# Patient Record
Sex: Male | Born: 1945 | Race: White | Hispanic: No | Marital: Married | Smoking: Never smoker
Health system: Southern US, Community
[De-identification: ages and names within clinical notes are randomized; demographics above are authoritative.]

## PROBLEM LIST (undated history)

## (undated) DIAGNOSIS — E119 Type 2 diabetes mellitus without complications: Secondary | ICD-10-CM

## (undated) DIAGNOSIS — I10 Essential (primary) hypertension: Secondary | ICD-10-CM

## (undated) DIAGNOSIS — H9190 Unspecified hearing loss, unspecified ear: Secondary | ICD-10-CM

## (undated) DIAGNOSIS — G629 Polyneuropathy, unspecified: Secondary | ICD-10-CM

## (undated) DIAGNOSIS — D649 Anemia, unspecified: Secondary | ICD-10-CM

## (undated) DIAGNOSIS — G8929 Other chronic pain: Secondary | ICD-10-CM

## (undated) DIAGNOSIS — M549 Dorsalgia, unspecified: Secondary | ICD-10-CM

---

## 2016-09-09 ENCOUNTER — Inpatient Hospital Stay (HOSPITAL_COMMUNITY)
Admission: EM | Admit: 2016-09-09 | Discharge: 2016-09-15 | DRG: 377 | Disposition: A | Discharge status: Home / Self Care | Payer: Medicare Other | Attending: Internal Medicine | Admitting: Internal Medicine

## 2016-09-09 ENCOUNTER — Emergency Department (HOSPITAL_COMMUNITY): Payer: Medicare Other

## 2016-09-09 ENCOUNTER — Encounter (HOSPITAL_COMMUNITY): Payer: Self-pay | Admitting: Emergency Medicine

## 2016-09-09 DIAGNOSIS — M545 Low back pain: Secondary | ICD-10-CM | POA: Diagnosis present

## 2016-09-09 DIAGNOSIS — D62 Acute posthemorrhagic anemia: Secondary | ICD-10-CM | POA: Diagnosis not present

## 2016-09-09 DIAGNOSIS — R531 Weakness: Secondary | ICD-10-CM | POA: Diagnosis not present

## 2016-09-09 DIAGNOSIS — I9589 Other hypotension: Secondary | ICD-10-CM

## 2016-09-09 DIAGNOSIS — H9190 Unspecified hearing loss, unspecified ear: Secondary | ICD-10-CM | POA: Diagnosis present

## 2016-09-09 DIAGNOSIS — E119 Type 2 diabetes mellitus without complications: Secondary | ICD-10-CM

## 2016-09-09 DIAGNOSIS — I951 Orthostatic hypotension: Secondary | ICD-10-CM | POA: Diagnosis present

## 2016-09-09 DIAGNOSIS — E872 Acidosis: Secondary | ICD-10-CM | POA: Diagnosis present

## 2016-09-09 DIAGNOSIS — K254 Chronic or unspecified gastric ulcer with hemorrhage: Principal | ICD-10-CM | POA: Diagnosis present

## 2016-09-09 DIAGNOSIS — R6510 Systemic inflammatory response syndrome (SIRS) of non-infectious origin without acute organ dysfunction: Secondary | ICD-10-CM | POA: Diagnosis not present

## 2016-09-09 DIAGNOSIS — R42 Dizziness and giddiness: Secondary | ICD-10-CM

## 2016-09-09 DIAGNOSIS — E1140 Type 2 diabetes mellitus with diabetic neuropathy, unspecified: Secondary | ICD-10-CM | POA: Diagnosis present

## 2016-09-09 DIAGNOSIS — Z79891 Long term (current) use of opiate analgesic: Secondary | ICD-10-CM

## 2016-09-09 DIAGNOSIS — M549 Dorsalgia, unspecified: Secondary | ICD-10-CM

## 2016-09-09 DIAGNOSIS — Z7984 Long term (current) use of oral hypoglycemic drugs: Secondary | ICD-10-CM

## 2016-09-09 DIAGNOSIS — A419 Sepsis, unspecified organism: Secondary | ICD-10-CM | POA: Diagnosis not present

## 2016-09-09 DIAGNOSIS — Z049 Encounter for examination and observation for unspecified reason: Secondary | ICD-10-CM | POA: Diagnosis not present

## 2016-09-09 DIAGNOSIS — Z79899 Other long term (current) drug therapy: Secondary | ICD-10-CM

## 2016-09-09 DIAGNOSIS — I959 Hypotension, unspecified: Secondary | ICD-10-CM

## 2016-09-09 DIAGNOSIS — K921 Melena: Secondary | ICD-10-CM

## 2016-09-09 DIAGNOSIS — K279 Peptic ulcer, site unspecified, unspecified as acute or chronic, without hemorrhage or perforation: Secondary | ICD-10-CM

## 2016-09-09 DIAGNOSIS — R571 Hypovolemic shock: Secondary | ICD-10-CM | POA: Diagnosis present

## 2016-09-09 DIAGNOSIS — D649 Anemia, unspecified: Secondary | ICD-10-CM

## 2016-09-09 DIAGNOSIS — G8929 Other chronic pain: Secondary | ICD-10-CM | POA: Diagnosis present

## 2016-09-09 DIAGNOSIS — K269 Duodenal ulcer, unspecified as acute or chronic, without hemorrhage or perforation: Secondary | ICD-10-CM | POA: Diagnosis present

## 2016-09-09 HISTORY — DX: Dorsalgia, unspecified: M54.9

## 2016-09-09 HISTORY — DX: Other chronic pain: G89.29

## 2016-09-09 HISTORY — DX: Type 2 diabetes mellitus without complications: E11.9

## 2016-09-09 HISTORY — DX: Polyneuropathy, unspecified: G62.9

## 2016-09-09 HISTORY — DX: Unspecified hearing loss, unspecified ear: H91.90

## 2016-09-09 LAB — CBC WITH DIFFERENTIAL/PLATELET
BASOS ABS: 0 10*3/uL (ref 0.0–0.1)
Basophils Relative: 0 %
Eosinophils Absolute: 0.2 10*3/uL (ref 0.0–0.7)
Eosinophils Relative: 1 %
HEMATOCRIT: 29 % — AB (ref 39.0–52.0)
HEMOGLOBIN: 9 g/dL — AB (ref 13.0–17.0)
LYMPHS ABS: 3.4 10*3/uL (ref 0.7–4.0)
Lymphocytes Relative: 15 %
MCH: 22.7 pg — AB (ref 26.0–34.0)
MCHC: 31 g/dL (ref 30.0–36.0)
MCV: 73 fL — AB (ref 78.0–100.0)
Monocytes Absolute: 1.5 10*3/uL — ABNORMAL HIGH (ref 0.1–1.0)
Monocytes Relative: 7 %
NEUTROS ABS: 16.8 10*3/uL — AB (ref 1.7–7.7)
Neutrophils Relative %: 77 %
Platelets: 283 10*3/uL (ref 150–400)
RBC: 3.97 MIL/uL — AB (ref 4.22–5.81)
RDW: 18.6 % — ABNORMAL HIGH (ref 11.5–15.5)
WBC: 21.9 10*3/uL — AB (ref 4.0–10.5)

## 2016-09-09 LAB — I-STAT CG4 LACTIC ACID, ED: Lactic Acid, Venous: 4.56 mmol/L (ref 0.5–1.9)

## 2016-09-09 LAB — POC OCCULT BLOOD, ED: Fecal Occult Bld: NEGATIVE

## 2016-09-09 IMAGING — CR DG CHEST 1V PORT
1 series · 1 of 1 positions shown · non-contrast
Comparison: None.

CLINICAL DATA: Weakness

EXAM:
PORTABLE CHEST 1 VIEW

[portable]
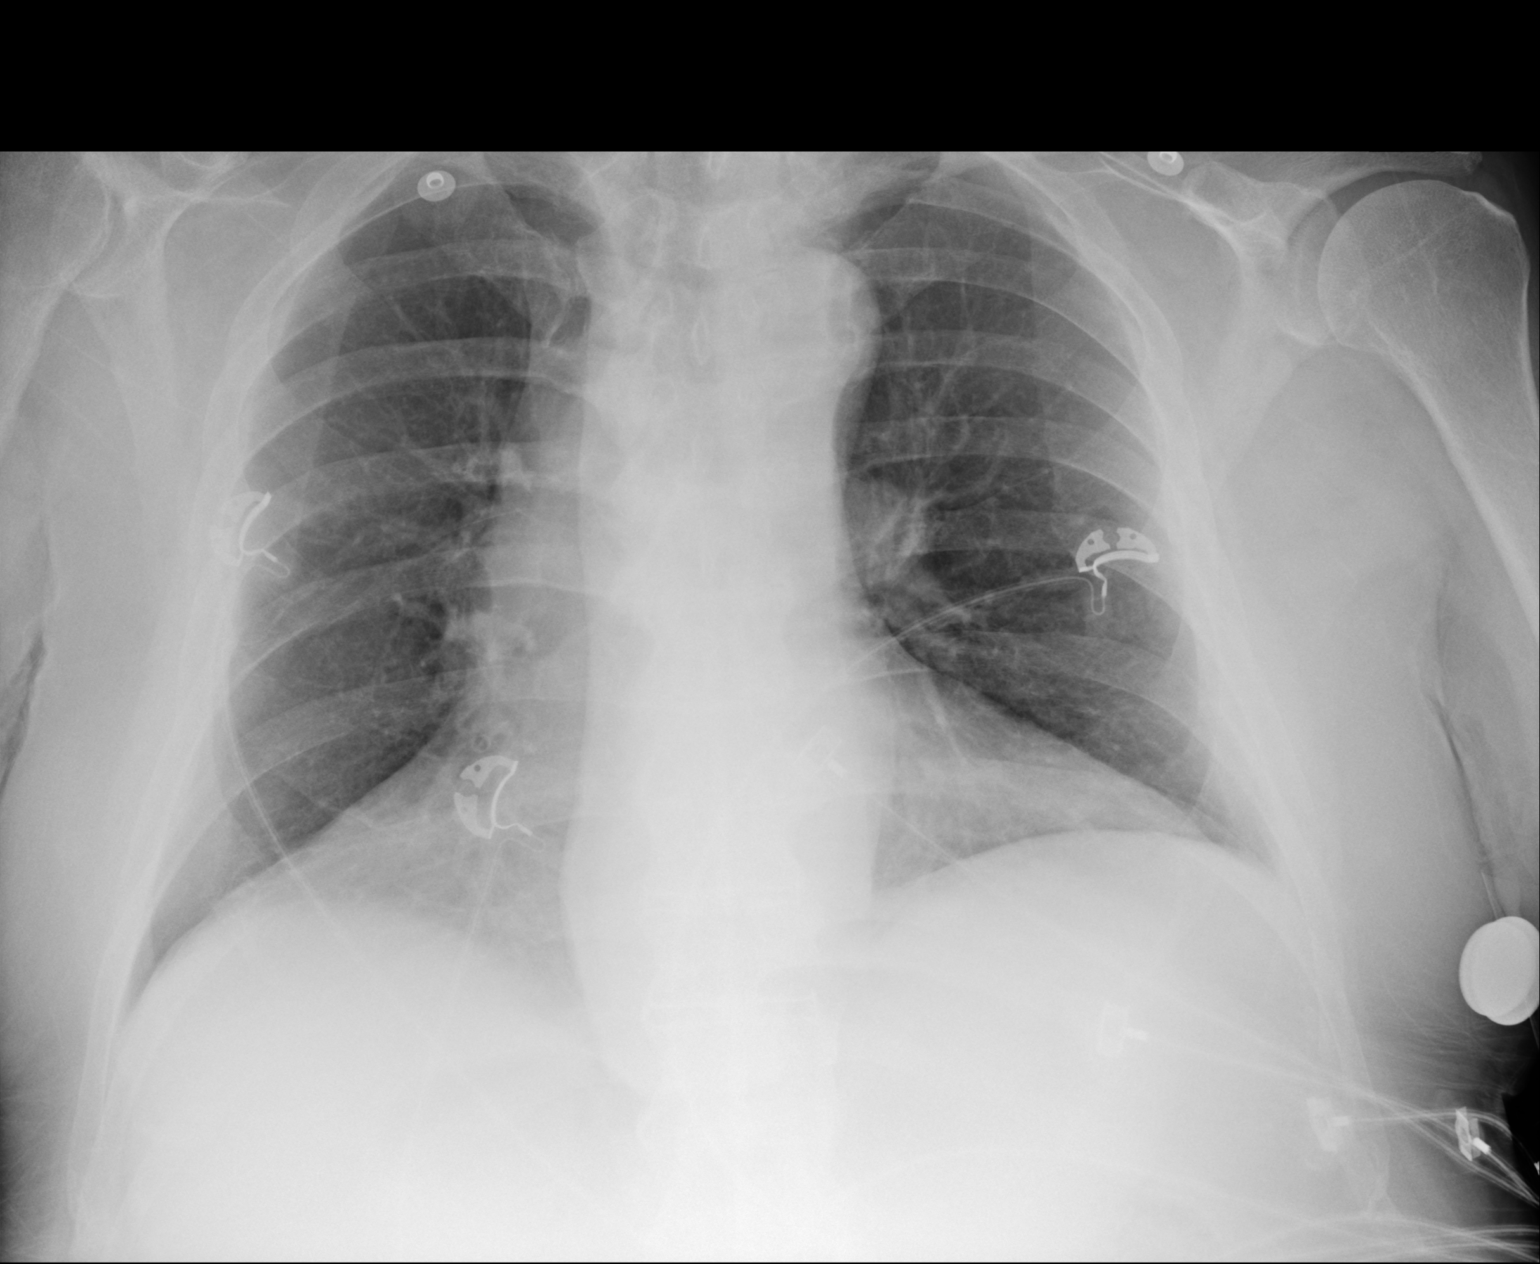

[1 of 1 positions shown; findings below may reference images not displayed]

FINDINGS: There is cardiomegaly without pulmonary edema or focal
consolidation. No pleural effusion or pneumothorax.
IMPRESSION: Cardiomegaly without active cardiopulmonary disease.

## 2016-09-09 MED ORDER — SODIUM CHLORIDE 0.9 % IV BOLUS (SEPSIS)
1000.0000 mL | Freq: Once | INTRAVENOUS | Status: AC
  Administered 2016-09-10: 1000 mL via INTRAVENOUS

## 2016-09-09 NOTE — ED Provider Notes (Signed)
[facility] DEPT Provider Note   CSN: [id number] Arrival date & time: 09/09/16  2302     History   Chief Complaint Chief Complaint  Patient presents with  . Weakness    HPI Richard Simmons is a 71 y.o. male.  The history is provided by the patient.  Weakness  This is a new problem. Episode onset: several hours ago. The problem has been gradually worsening. There was no focality noted. There has been no fever. Associated symptoms include shortness of breath and vomiting. Pertinent negatives include no chest pain and no headaches. Associated medical issues do not include trauma.  pt reports he was lying in the floor for his chronic back pain (he typically lays in floor for pain control) and when trying to get up he felt generalized weakness.  He also reports vomiting.   No HA No CP No ABD pain No new LE weakness No new meds No recent hospital stays [facility] It is reported he was vomiting coffee ground emesis   Past Medical History:  Diagnosis Date  . Chronic back pain   . Diabetes mellitus without complication ([place])   . Hearing loss   . Neuropathy     There are no active problems to display for this patient.   No past surgical history on file.     Home Medications    Prior to Admission medications   Not on File    Family History History reviewed. No pertinent family history.  Social History Social History  Substance Use Topics  . Smoking status: Never Smoker  . Smokeless tobacco: Current User  . Alcohol use No     Allergies   Patient has no known allergies.   Review of Systems Review of Systems  Constitutional: Positive for fatigue. Negative for fever.  Respiratory: Positive for shortness of breath.   Cardiovascular: Negative for chest pain.  Gastrointestinal: Positive for constipation and vomiting. Negative for abdominal pain and blood in stool.  Genitourinary: Negative for dysuria.  Neurological: Positive for weakness. Negative  for headaches.  All other systems reviewed and are negative.    Physical Exam Updated Vital Signs BP 105/66 (BP Location: Left Arm)   Pulse (!) 128   Temp 98.1 F (36.7 C) (Rectal)   Resp 15   Ht 1.778 m (5\' 10" )   Wt 98 kg (216 lb)   SpO2 94%   BMI 30.99 kg/m   Physical Exam CONSTITUTIONAL: Elderly, but no acute distress HEAD: Normocephalic/atraumatic EYES: EOMI/PERRL ENMT: Mucous membranes dry NECK: supple no meningeal signs SPINE/BACK:entire spine nontender CV: S1/S2 noted, no murmurs/rubs/gallops noted LUNGS: crackles bilaterally, no distress noted ABDOMEN: soft, nontender Rectal - stool color whitish, no blood/melena noted, male/nurse chaperone present GU:no cva tenderness NEURO: Pt is awake/alert/appropriate, moves all extremitiesx4.  No facial droop.  No arm/leg drift noted.   EXTREMITIES: pulses normal/equal, full ROM SKIN: warm, color normal PSYCH: no abnormalities of mood noted, alert and oriented to situation   ED Treatments / Results  Labs (all labs ordered are listed, but only abnormal results are displayed) Labs Reviewed  CBC WITH DIFFERENTIAL/PLATELET - Abnormal; Notable for the following:       Result Value   WBC 21.9 (*)    RBC 3.97 (*)    Hemoglobin 9.0 (*)    HCT 29.0 (*)    MCV 73.0 (*)    MCH 22.7 (*)    RDW 18.6 (*)    Neutro Abs 16.8 (*)    Monocytes  Absolute 1.5 (*)    All other components within normal limits  COMPREHENSIVE METABOLIC PANEL - Abnormal; Notable for the following:    Glucose, Bld 194 (*)    Calcium 8.1 (*)    Total Protein 5.4 (*)    Albumin 2.7 (*)    ALT 13 (*)    All other components within normal limits  TROPONIN I - Abnormal; Notable for the following:    Troponin I 0.04 (*)    All other components within normal limits  RAPID URINE DRUG SCREEN, HOSP PERFORMED - Abnormal; Notable for the following:    Opiates POSITIVE (*)    All other components within normal limits  I-STAT CG4 LACTIC ACID, ED - Abnormal;  Notable for the following:    Lactic Acid, Venous 4.56 (*)    All other components within normal limits  I-STAT CG4 LACTIC ACID, ED - Abnormal; Notable for the following:    Lactic Acid, Venous 4.76 (*)    All other components within normal limits  CULTURE, BLOOD (ROUTINE X 2)  CULTURE, BLOOD (ROUTINE X 2)  URINE CULTURE  LIPASE, BLOOD  ETHANOL  URINALYSIS, ROUTINE W REFLEX MICROSCOPIC  POC OCCULT BLOOD, ED  TYPE AND SCREEN    EKG  EKG Interpretation  Date/Time:  Wednesday September 09 2016 23:06:44 EDT Ventricular Rate:  123 PR Interval:    QRS Duration: 94 QT Interval:  306 QTC Calculation: 438 R Axis:   -62 Text Interpretation:  Sinus tachycardia Left anterior fascicular block Consider anterior infarct No previous ECGs available Abnormal ekg Confirmed by Shelby, Anderle ([id number]) on 09/09/2016 11:32:23 PM       Radiology Dg Chest Port 1 View  Result Date: 09/10/2016 CLINICAL DATA:  Weakness EXAM: PORTABLE CHEST 1 VIEW COMPARISON:  None. FINDINGS: There is cardiomegaly without pulmonary edema or focal consolidation. No pleural effusion or pneumothorax. IMPRESSION: Cardiomegaly without active cardiopulmonary disease. Electronically Signed   By: Ulyses Jarred M.D.   On: 09/10/2016 00:12    Procedures Procedures  CRITICAL CARE Performed by: Sharyon Cable Total critical care time: 40 minutes Critical care time was exclusive of separately billable procedures and treating other patients. Critical care was necessary to treat or prevent imminent or life-threatening deterioration. Critical care was time spent personally by me on the following activities: development of treatment plan with patient and/or surrogate as well as nursing, discussions with consultants, evaluation of patient's response to treatment, examination of patient, obtaining history from patient or surrogate, ordering and performing treatments and interventions, ordering and review of laboratory studies, ordering  and review of radiographic studies, pulse oximetry and re-evaluation of patient's condition. PATIENT WITH HYPOTENSION, ELEVATED LACTATE, REQUIRING IV FLUIDS AND IV ANTIBIOTICS AND ADMISSION  Medications Ordered in ED Medications  sodium chloride 0.9 % bolus 1,000 mL (0 mLs Intravenous Stopped 09/10/16 0214)  sodium chloride 0.9 % bolus 1,000 mL (0 mLs Intravenous Stopped 09/10/16 0146)    And  sodium chloride 0.9 % bolus 1,000 mL (0 mLs Intravenous Stopped 09/10/16 0242)    And  sodium chloride 0.9 % bolus 1,000 mL (1,000 mLs Intravenous New Bag/Given 09/10/16 0214)  piperacillin-tazobactam (ZOSYN) IVPB 3.375 g (0 g Intravenous Stopped 09/10/16 0146)  vancomycin (VANCOCIN) IVPB 1000 mg/200 mL premix (0 mg Intravenous Stopped 09/10/16 0146)     Initial Impression / Assessment and Plan / ED Course  I have reviewed the triage vital signs and the nursing notes.  Pertinent labs & imaging results that were available during my care of the  patient were reviewed by me and considered in my medical decision making (see chart for details).     12:16 AM Pt with generalized weakness Reports he had been otherwise well Family reports he was pale and had global weakness and had near syncopal episode Lactate elevated Code sepsis called 1:15 AM Pt improving Awake/alert SBP >90  Sepsis - Repeat Assessment  Performed at:    01:15  Vitals     Blood pressure (!) 91/54, pulse 96, temperature 98.1 F (36.7 C), temperature source Rectal, resp. rate 18, height 1.778 m (5\' 10" ), weight 98 kg (216 lb), SpO2 95 %.  Heart:     Tachycardic  Lungs:    Rhonchi  Capillary Refill:   <2 sec  Peripheral Pulse:   Radial pulse palpable  Skin:     Normal Color    3:39 AM OVERALL PT IMPROVED BP 128/73   Pulse 80   Temp 98.1 F (36.7 C) (Rectal)   Resp 14   Ht 1.778 m (5\' 10" )   Wt 98 kg (216 lb)   SpO2 98%   BMI 30.99 kg/m  HOWEVER LACTATE HAS NOT CLEARED BP IMPROVED COULD BE MED RELATED  (METFORMIN) BUT DUE TO ELEVATED WBC, INFECTIOUS ETIOLOGY STILL LIKELY PLAN ADMISSION D/W DR Collier Salina LE FOR ADMISSION CONTINUE SEPSIS PROTOCOL  Final Clinical Impressions(s) / ED Diagnoses   Final diagnoses:  Sepsis, due to unspecified organism [facility])  Other specified hypotension    New Prescriptions New Prescriptions   No medications on file     Ripley Fraise, MD 09/10/16 0340

## 2016-09-09 NOTE — ED Triage Notes (Signed)
Per EMS they were called out for sick call, patient had c/o weakness, due to chronic back pain, given to 500 cc bags of fluid, having low b/p, patient currently vomiting coffee colored emesis.

## 2016-09-10 DIAGNOSIS — R42 Dizziness and giddiness: Secondary | ICD-10-CM | POA: Diagnosis not present

## 2016-09-10 DIAGNOSIS — I951 Orthostatic hypotension: Secondary | ICD-10-CM | POA: Diagnosis present

## 2016-09-10 DIAGNOSIS — K279 Peptic ulcer, site unspecified, unspecified as acute or chronic, without hemorrhage or perforation: Secondary | ICD-10-CM | POA: Diagnosis not present

## 2016-09-10 DIAGNOSIS — E119 Type 2 diabetes mellitus without complications: Secondary | ICD-10-CM

## 2016-09-10 DIAGNOSIS — I959 Hypotension, unspecified: Secondary | ICD-10-CM | POA: Diagnosis not present

## 2016-09-10 DIAGNOSIS — K921 Melena: Secondary | ICD-10-CM | POA: Diagnosis not present

## 2016-09-10 DIAGNOSIS — K922 Gastrointestinal hemorrhage, unspecified: Secondary | ICD-10-CM | POA: Diagnosis not present

## 2016-09-10 DIAGNOSIS — A419 Sepsis, unspecified organism: Secondary | ICD-10-CM | POA: Diagnosis present

## 2016-09-10 DIAGNOSIS — I9589 Other hypotension: Secondary | ICD-10-CM

## 2016-09-10 DIAGNOSIS — G8929 Other chronic pain: Secondary | ICD-10-CM | POA: Diagnosis not present

## 2016-09-10 DIAGNOSIS — Z79891 Long term (current) use of opiate analgesic: Secondary | ICD-10-CM | POA: Diagnosis not present

## 2016-09-10 DIAGNOSIS — D649 Anemia, unspecified: Secondary | ICD-10-CM | POA: Diagnosis not present

## 2016-09-10 DIAGNOSIS — K269 Duodenal ulcer, unspecified as acute or chronic, without hemorrhage or perforation: Secondary | ICD-10-CM | POA: Diagnosis not present

## 2016-09-10 DIAGNOSIS — Z7984 Long term (current) use of oral hypoglycemic drugs: Secondary | ICD-10-CM | POA: Diagnosis not present

## 2016-09-10 DIAGNOSIS — E872 Acidosis: Secondary | ICD-10-CM | POA: Diagnosis not present

## 2016-09-10 DIAGNOSIS — Z79899 Other long term (current) drug therapy: Secondary | ICD-10-CM | POA: Diagnosis not present

## 2016-09-10 DIAGNOSIS — M549 Dorsalgia, unspecified: Secondary | ICD-10-CM

## 2016-09-10 DIAGNOSIS — E1140 Type 2 diabetes mellitus with diabetic neuropathy, unspecified: Secondary | ICD-10-CM | POA: Diagnosis not present

## 2016-09-10 DIAGNOSIS — K250 Acute gastric ulcer with hemorrhage: Secondary | ICD-10-CM | POA: Diagnosis not present

## 2016-09-10 DIAGNOSIS — H9190 Unspecified hearing loss, unspecified ear: Secondary | ICD-10-CM | POA: Diagnosis not present

## 2016-09-10 DIAGNOSIS — K254 Chronic or unspecified gastric ulcer with hemorrhage: Secondary | ICD-10-CM | POA: Diagnosis not present

## 2016-09-10 DIAGNOSIS — E118 Type 2 diabetes mellitus with unspecified complications: Secondary | ICD-10-CM | POA: Diagnosis not present

## 2016-09-10 DIAGNOSIS — R531 Weakness: Secondary | ICD-10-CM | POA: Diagnosis not present

## 2016-09-10 DIAGNOSIS — D62 Acute posthemorrhagic anemia: Secondary | ICD-10-CM | POA: Diagnosis not present

## 2016-09-10 DIAGNOSIS — R6510 Systemic inflammatory response syndrome (SIRS) of non-infectious origin without acute organ dysfunction: Secondary | ICD-10-CM | POA: Diagnosis not present

## 2016-09-10 DIAGNOSIS — M545 Low back pain: Secondary | ICD-10-CM | POA: Diagnosis not present

## 2016-09-10 DIAGNOSIS — R571 Hypovolemic shock: Secondary | ICD-10-CM | POA: Diagnosis not present

## 2016-09-10 LAB — GLUCOSE, CAPILLARY
GLUCOSE-CAPILLARY: 160 mg/dL — AB (ref 65–99)
GLUCOSE-CAPILLARY: 151 mg/dL — AB (ref 65–99)
GLUCOSE-CAPILLARY: 146 mg/dL — AB (ref 65–99)
GLUCOSE-CAPILLARY: 145 mg/dL — AB (ref 65–99)
Glucose-Capillary: 130 mg/dL — ABNORMAL HIGH (ref 65–99)

## 2016-09-10 LAB — CBC WITH DIFFERENTIAL/PLATELET
BASOS ABS: 0 10*3/uL (ref 0.0–0.1)
BASOS PCT: 0 %
EOS ABS: 0 10*3/uL (ref 0.0–0.7)
EOS PCT: 0 %
HCT: 23.7 % — ABNORMAL LOW (ref 39.0–52.0)
Hemoglobin: 7.4 g/dL — ABNORMAL LOW (ref 13.0–17.0)
Lymphocytes Relative: 15 %
Lymphs Abs: 2.1 10*3/uL (ref 0.7–4.0)
MCH: 22.8 pg — ABNORMAL LOW (ref 26.0–34.0)
MCHC: 31.2 g/dL (ref 30.0–36.0)
MCV: 73.1 fL — ABNORMAL LOW (ref 78.0–100.0)
Monocytes Absolute: 0.8 10*3/uL (ref 0.1–1.0)
Monocytes Relative: 6 %
Neutro Abs: 10.6 10*3/uL — ABNORMAL HIGH (ref 1.7–7.7)
Neutrophils Relative %: 79 %
PLATELETS: 214 10*3/uL (ref 150–400)
RBC: 3.24 MIL/uL — AB (ref 4.22–5.81)
RDW: 18.9 % — AB (ref 11.5–15.5)
WBC: 13.5 10*3/uL — AB (ref 4.0–10.5)

## 2016-09-10 LAB — COMPREHENSIVE METABOLIC PANEL
ALBUMIN: 2.7 g/dL — AB (ref 3.5–5.0)
ALT: 13 U/L — ABNORMAL LOW (ref 17–63)
ANION GAP: 9 (ref 5–15)
AST: 20 U/L (ref 15–41)
Alkaline Phosphatase: 69 U/L (ref 38–126)
BUN: 17 mg/dL (ref 6–20)
CO2: 24 mmol/L (ref 22–32)
Calcium: 8.1 mg/dL — ABNORMAL LOW (ref 8.9–10.3)
Chloride: 107 mmol/L (ref 101–111)
Creatinine, Ser: 1.03 mg/dL (ref 0.61–1.24)
GFR calc non Af Amer: >60 mL/min (ref >60)
GLUCOSE: 194 mg/dL — AB (ref 65–99)
POTASSIUM: 4.7 mmol/L (ref 3.5–5.1)
Sodium: 140 mmol/L (ref 135–145)
Total Bilirubin: 0.4 mg/dL (ref 0.3–1.2)
Total Protein: 5.4 g/dL — ABNORMAL LOW (ref 6.5–8.1)

## 2016-09-10 LAB — TROPONIN I: TROPONIN I: 0.04 ng/mL — AB (ref <0.03)

## 2016-09-10 LAB — URINALYSIS, ROUTINE W REFLEX MICROSCOPIC
BILIRUBIN URINE: NEGATIVE
Glucose, UA: NEGATIVE mg/dL
Hgb urine dipstick: NEGATIVE
KETONES UR: NEGATIVE mg/dL
LEUKOCYTES UA: NEGATIVE
NITRITE: NEGATIVE
PH: 5 (ref 5.0–8.0)
Protein, ur: NEGATIVE mg/dL
SPECIFIC GRAVITY, URINE: 1.017 (ref 1.005–1.030)

## 2016-09-10 LAB — LACTIC ACID, PLASMA
LACTIC ACID, VENOUS: 2.3 mmol/L — AB (ref 0.5–1.9)
Lactic Acid, Venous: 1.9 mmol/L (ref 0.5–1.9)

## 2016-09-10 LAB — RAPID URINE DRUG SCREEN, HOSP PERFORMED
Amphetamines: NOT DETECTED
BARBITURATES: NOT DETECTED
Benzodiazepines: NOT DETECTED
COCAINE: NOT DETECTED
OPIATES: POSITIVE — AB
Tetrahydrocannabinol: NOT DETECTED

## 2016-09-10 LAB — BASIC METABOLIC PANEL
ANION GAP: 6 (ref 5–15)
BUN: 26 mg/dL — ABNORMAL HIGH (ref 6–20)
CALCIUM: 7.4 mg/dL — AB (ref 8.9–10.3)
CO2: 23 mmol/L (ref 22–32)
Chloride: 106 mmol/L (ref 101–111)
Creatinine, Ser: 0.8 mg/dL (ref 0.61–1.24)
GFR calc Af Amer: >60 mL/min (ref >60)
Glucose, Bld: 156 mg/dL — ABNORMAL HIGH (ref 65–99)
POTASSIUM: 4.7 mmol/L (ref 3.5–5.1)
SODIUM: 135 mmol/L (ref 135–145)

## 2016-09-10 LAB — TSH: TSH: 2.011 u[IU]/mL (ref 0.350–4.500)

## 2016-09-10 LAB — LIPASE, BLOOD: Lipase: 19 U/L (ref 11–51)

## 2016-09-10 LAB — MRSA PCR SCREENING: MRSA BY PCR: NEGATIVE

## 2016-09-10 LAB — I-STAT CG4 LACTIC ACID, ED: Lactic Acid, Venous: 4.76 mmol/L (ref 0.5–1.9)

## 2016-09-10 LAB — ETHANOL: Alcohol, Ethyl (B): <5 mg/dL (ref <5)

## 2016-09-10 MED ORDER — INSULIN ASPART 100 UNIT/ML ~~LOC~~ SOLN
0.0000 [IU] | Freq: Three times a day (TID) | SUBCUTANEOUS | Status: DC
  Administered 2016-09-10 – 2016-09-11 (×4): 2 [IU] via SUBCUTANEOUS
  Administered 2016-09-11: 3 [IU] via SUBCUTANEOUS
  Administered 2016-09-11: 2 [IU] via SUBCUTANEOUS
  Administered 2016-09-13: 3 [IU] via SUBCUTANEOUS
  Administered 2016-09-13 (×2): 2 [IU] via SUBCUTANEOUS
  Administered 2016-09-14: 3 [IU] via SUBCUTANEOUS
  Administered 2016-09-14 – 2016-09-15 (×2): 2 [IU] via SUBCUTANEOUS
  Administered 2016-09-15: 3 [IU] via SUBCUTANEOUS

## 2016-09-10 MED ORDER — CITALOPRAM HYDROBROMIDE 20 MG PO TABS
40.0000 mg | ORAL_TABLET | Freq: Every day | ORAL | Status: DC
  Administered 2016-09-10 – 2016-09-15 (×5): 40 mg via ORAL
  Filled 2016-09-10 (×5): qty 2

## 2016-09-10 MED ORDER — SODIUM CHLORIDE 0.9 % IV BOLUS (SEPSIS)
1000.0000 mL | Freq: Once | INTRAVENOUS | Status: AC
  Administered 2016-09-10: 1000 mL via INTRAVENOUS

## 2016-09-10 MED ORDER — ENOXAPARIN SODIUM 40 MG/0.4ML ~~LOC~~ SOLN
40.0000 mg | SUBCUTANEOUS | Status: DC
  Administered 2016-09-10 – 2016-09-12 (×2): 40 mg via SUBCUTANEOUS
  Filled 2016-09-10 (×3): qty 0.4

## 2016-09-10 MED ORDER — CLONAZEPAM 0.5 MG PO TABS
0.5000 mg | ORAL_TABLET | Freq: Every day | ORAL | Status: DC
  Administered 2016-09-10 – 2016-09-14 (×5): 0.5 mg via ORAL
  Filled 2016-09-10 (×5): qty 1

## 2016-09-10 MED ORDER — DEXTROSE-NACL 5-0.9 % IV SOLN
INTRAVENOUS | Status: DC
  Administered 2016-09-10: 100 mL via INTRAVENOUS

## 2016-09-10 MED ORDER — ACETAMINOPHEN 650 MG RE SUPP: 650.0000 mg | Freq: Four times a day (QID) | RECTAL | Status: DC | PRN

## 2016-09-10 MED ORDER — ONDANSETRON HCL 4 MG PO TABS: 4.0000 mg | ORAL_TABLET | Freq: Four times a day (QID) | ORAL | Status: DC | PRN

## 2016-09-10 MED ORDER — SODIUM CHLORIDE 0.9% FLUSH
3.0000 mL | Freq: Two times a day (BID) | INTRAVENOUS | Status: DC
  Administered 2016-09-10 – 2016-09-14 (×10): 3 mL via INTRAVENOUS

## 2016-09-10 MED ORDER — CODEINE SULFATE 30 MG PO TABS
60.0000 mg | ORAL_TABLET | ORAL | Status: DC | PRN
  Administered 2016-09-10 – 2016-09-11 (×2): 60 mg via ORAL
  Filled 2016-09-10 (×2): qty 2

## 2016-09-10 MED ORDER — GABAPENTIN 400 MG PO CAPS
400.0000 mg | ORAL_CAPSULE | Freq: Three times a day (TID) | ORAL | Status: DC
Start: 2016-09-10 — End: 2016-09-15
  Administered 2016-09-10 – 2016-09-15 (×15): 400 mg via ORAL
  Filled 2016-09-10 (×16): qty 1

## 2016-09-10 MED ORDER — VANCOMYCIN HCL 10 G IV SOLR
1250.0000 mg | Freq: Two times a day (BID) | INTRAVENOUS | Status: DC
  Filled 2016-09-10: qty 1250

## 2016-09-10 MED ORDER — VANCOMYCIN HCL IN DEXTROSE 1-5 GM/200ML-% IV SOLN
1000.0000 mg | Freq: Once | INTRAVENOUS | Status: AC
  Administered 2016-09-10: 1000 mg via INTRAVENOUS
  Filled 2016-09-10: qty 200

## 2016-09-10 MED ORDER — PIPERACILLIN-TAZOBACTAM 3.375 G IVPB
3.3750 g | Freq: Three times a day (TID) | INTRAVENOUS | Status: DC
Start: 2016-09-10 — End: 2016-09-10
  Filled 2016-09-10: qty 50

## 2016-09-10 MED ORDER — SODIUM CHLORIDE 0.9 % IV BOLUS (SEPSIS)
1000.0000 mL | Freq: Once | INTRAVENOUS | Status: AC
Start: 2016-09-10 — End: 2016-09-10
  Administered 2016-09-10: 1000 mL via INTRAVENOUS

## 2016-09-10 MED ORDER — SODIUM CHLORIDE 0.9 % IV SOLN
INTRAVENOUS | Status: DC
  Administered 2016-09-10 – 2016-09-11 (×2): via INTRAVENOUS

## 2016-09-10 MED ORDER — ONDANSETRON HCL 4 MG/2ML IJ SOLN
4.0000 mg | Freq: Four times a day (QID) | INTRAMUSCULAR | Status: DC | PRN
  Administered 2016-09-12: 4 mg via INTRAVENOUS
  Filled 2016-09-10: qty 2

## 2016-09-10 MED ORDER — PIPERACILLIN-TAZOBACTAM 3.375 G IVPB 30 MIN
3.3750 g | Freq: Once | INTRAVENOUS | Status: AC
  Administered 2016-09-10: 3.375 g via INTRAVENOUS
  Filled 2016-09-10: qty 50

## 2016-09-10 MED ORDER — ACETAMINOPHEN 325 MG PO TABS
650.0000 mg | ORAL_TABLET | Freq: Four times a day (QID) | ORAL | Status: DC | PRN
  Administered 2016-09-14 (×2): 650 mg via ORAL
  Filled 2016-09-10 (×2): qty 2

## 2016-09-10 MED ORDER — INSULIN ASPART 100 UNIT/ML ~~LOC~~ SOLN: 0.0000 [IU] | Freq: Every day | SUBCUTANEOUS | Status: DC

## 2016-09-10 NOTE — H&P (Signed)
History and Physical    Richard Simmons OVF:[id number] DOB: 1945-11-20 DOA: 09/09/2016  PCP: Alain Marion, MD  Patient coming from: Home.    Chief Complaint: weakness and vertigo.   HPI: Richard Simmons is an 71 y.o. male usually get his care at the [place], with hx of DM on Metformin, neuropathy on Neurontin, and chronic low back pain on narcotics, fell ill this morning with some vertigo and weakness.  He has no HA, nausea, vomtiing, coughs, abdominal pain, rash, fever or chills.   He has no distant travel, ill contact, new meds, or being on steroids.  Evaluation in the ER included a marked leukocytosis with WBC of 21K, Hb of 9 g per dL, and normal LFT and renal Fx tests.  His lactic acid was elevated to 4 and he was found originally hypotensive.  He has no GU complaints.  His UA and CXR was negative.  Fortunately, his BP normalized with IVF.  He was alert, orient, and converse normally.   Hospitalist was asked to admit him for suspicion of sepsis though without source.  He was started on broad spectrum antibiotics after being pan cultured.   ED Course:  See above.  Rewiew of Systems:  Constitutional: Negative for malaise, fever and chills. No significant weight loss or weight gain Eyes: Negative for eye pain, redness and discharge, diplopia, visual changes, or flashes of light. ENMT: Negative for ear pain, hoarseness, nasal congestion, sinus pressure and sore throat. No headaches; tinnitus, drooling, or problem swallowing. Cardiovascular: Negative for chest pain, palpitations, diaphoresis, dyspnea and peripheral edema. ; No orthopnea, PND Respiratory: Negative for cough, hemoptysis, wheezing and stridor. No pleuritic chestpain. Gastrointestinal: Negative for diarrhea, constipation,  melena, blood in stool, hematemesis, jaundice and rectal bleeding.    Genitourinary: Negative for frequency, dysuria, incontinence,flank pain and hematuria; Musculoskeletal: Negative for back pain and neck pain.  Negative for swelling and trauma.;  Skin: . Negative for pruritus, rash, abrasions, bruising and skin lesion.; ulcerations Neuro: Negative for headache,and neck stiffness. Negative for weakness, altered level of consciousness , altered mental status, extremity weakness, burning feet, involuntary movement, seizure and syncope.  Psych: negative for anxiety, depression, insomnia, tearfulness, panic attacks, hallucinations, paranoia, suicidal or homicidal ideation    Past Medical History:  Diagnosis Date  . Chronic back pain   . Diabetes mellitus without complication ([place])   . Hearing loss   . Neuropathy     Rewiew of Systems:  Constitutional: Negative for malaise, fever and chills. No significant weight loss or weight gain Eyes: Negative for eye pain, redness and discharge, diplopia, visual changes, or flashes of light. ENMT: Negative for ear pain, hoarseness, nasal congestion, sinus pressure and sore throat. No headaches; tinnitus, drooling, or problem swallowing. Cardiovascular: Negative for chest pain, palpitations, diaphoresis, dyspnea and peripheral edema. ; No orthopnea, PND Respiratory: Negative for cough, hemoptysis, wheezing and stridor. No pleuritic chestpain. Gastrointestinal: Negative for nausea, vomiting, diarrhea, constipation, abdominal pain, melena, blood in stool, hematemesis, jaundice and rectal bleeding.    Genitourinary: Negative for frequency, dysuria, incontinence,flank pain and hematuria; Musculoskeletal: Negative for back pain and neck pain. Negative for swelling and trauma.;  Skin: . Negative for pruritus, rash, abrasions, bruising and skin lesion.; ulcerations Neuro: Negative for headache, lightheadedness and neck stiffness. Negative for weakness, altered level of consciousness , altered mental status, extremity weakness, burning feet, involuntary movement, seizure and syncope.  Psych: negative for anxiety, depression, insomnia, tearfulness, panic attacks,  hallucinations, paranoia, suicidal or homicidal ideation   No past surgical  history on file.   reports that he has never smoked. He uses smokeless tobacco. He reports that he does not drink alcohol or use drugs.  No Known Allergies  History reviewed. No pertinent family history.   Prior to Admission medications   Medication Sig Start Date End Date Taking? Authorizing Provider  amitriptyline (ELAVIL) 25 MG tablet Take 25 mg by mouth at bedtime. Three tablets by mouth at bedtime.   Yes [provider]  citalopram (CELEXA) 40 MG tablet Take 40 mg by mouth daily. Take one-half tablet by mouth once daily.   Yes [provider]  clonazePAM (KLONOPIN) 0.5 MG tablet Take 0.5 mg by mouth at bedtime. Take one tablet by mouth at bedtime for anxiety.   Yes [provider]  codeine 30 MG tablet Take 30 mg by mouth every 8 (eight) hours as needed. Take two tablets by mouth every 8 hrs prn.   Yes [provider]  gabapentin (NEURONTIN) 400 MG capsule Take 400 mg by mouth 3 (three) times daily. Two tablets by mouth three times a day.   Yes [provider]  metFORMIN (GLUCOPHAGE) 850 MG tablet Take 850 mg by mouth 2 (two) times daily with a meal.   Yes [provider]    Physical Exam: Vitals:   09/10/16 0130 09/10/16 0200 09/10/16 0230 09/10/16 0300  BP: (!) 107/59 111/60 91/63 128/73  Pulse: 88 80 90 80  Resp: 17 16 16 14   Temp:      TempSrc:      SpO2: 100% 100% 99% 98%  Weight:      Height:          Constitutional: NAD, calm, comfortable Vitals:   09/10/16 0130 09/10/16 0200 09/10/16 0230 09/10/16 0300  BP: (!) 107/59 111/60 91/63 128/73  Pulse: 88 80 90 80  Resp: 17 16 16 14   Temp:      TempSrc:      SpO2: 100% 100% 99% 98%  Weight:      Height:       Eyes: PERRL, lids and conjunctivae normal ENMT: Mucous membranes are moist. Posterior pharynx clear of any exudate or lesions.Normal dentition.  Neck: normal, supple, no masses,  no thyromegaly Respiratory: clear to auscultation bilaterally, no wheezing, no crackles. Normal respiratory effort. No accessory muscle use.  Cardiovascular: Regular rate and rhythm, no murmurs / rubs / gallops. No extremity edema. 2+ pedal pulses. No carotid bruits.  Abdomen: no tenderness, no masses palpated. No hepatosplenomegaly. Bowel sounds positive.  Musculoskeletal: no clubbing / cyanosis. No joint deformity upper and lower extremities. Good ROM, no contractures. Normal muscle tone.  Skin: no rashes, lesions, ulcers. No induration Neurologic: CN 2-12 grossly intact. Sensation intact, DTR normal. Strength 5/5 in all 4.  Psychiatric: Normal judgment and insight. Alert and oriented x 3. Normal mood.    Labs on Admission: I have personally reviewed following labs and imaging studies CBC:  Recent Labs Lab 09/09/16 2337  WBC 21.9*  NEUTROABS 16.8*  HGB 9.0*  HCT 29.0*  MCV 73.0*  PLT 932   Basic Metabolic Panel:  Recent Labs Lab 09/09/16 2337  NA 140  K 4.7  CL 107  CO2 24  GLUCOSE 194*  BUN 17  CREATININE 1.03  CALCIUM 8.1*   GFR: Estimated Creatinine Clearance: 78.3 mL/min (by C-G formula based on SCr of 1.03 mg/dL). Liver Function Tests:  Recent Labs Lab 09/09/16 2337  AST 20  ALT 13*  ALKPHOS 69  BILITOT 0.4  PROT  5.4*  ALBUMIN 2.7*    Recent Labs Lab 09/09/16 2337  LIPASE 19   Cardiac Enzymes:  Recent Labs Lab 09/09/16 2337  TROPONINI 0.04*   Urine analysis:    Component Value Date/Time   COLORURINE YELLOW 09/10/2016 0300   APPEARANCEUR CLEAR 09/10/2016 0300   LABSPEC 1.017 09/10/2016 0300   PHURINE 5.0 09/10/2016 0300   GLUCOSEU NEGATIVE 09/10/2016 0300   HGBUR NEGATIVE 09/10/2016 0300   BILIRUBINUR NEGATIVE 09/10/2016 0300   KETONESUR NEGATIVE 09/10/2016 0300   PROTEINUR NEGATIVE 09/10/2016 0300   NITRITE NEGATIVE 09/10/2016 0300   LEUKOCYTESUR NEGATIVE 09/10/2016 0300    Recent Results (from the past 240 hour(s))  Blood  Culture (routine x 2)     Status: None (Preliminary result)   Collection Time: 09/10/16 12:16 AM  Result Value Ref Range Status   Specimen Description BLOOD LEFT ARM  Final   Special Requests   Final    BOTTLES DRAWN AEROBIC AND ANAEROBIC Blood Culture adequate volume   Culture PENDING  Incomplete   Report Status PENDING  Incomplete  Blood Culture (routine x 2)     Status: None (Preliminary result)   Collection Time: 09/10/16 12:24 AM  Result Value Ref Range Status   Specimen Description BLOOD LEFT ARM  Final   Special Requests   Final    BOTTLES DRAWN AEROBIC AND ANAEROBIC Blood Culture adequate volume   Culture PENDING  Incomplete   Report Status PENDING  Incomplete     Radiological Exams on Admission: Dg Chest Port 1 View  Result Date: 09/10/2016 CLINICAL DATA:  Weakness EXAM: PORTABLE CHEST 1 VIEW COMPARISON:  None. FINDINGS: There is cardiomegaly without pulmonary edema or focal consolidation. No pleural effusion or pneumothorax. IMPRESSION: Cardiomegaly without active cardiopulmonary disease. Electronically Signed   By: Ulyses Jarred M.D.   On: 09/10/2016 00:12    EKG: Independently reviewed.   Assessment/Plan Principal Problem:   Sepsis ([place]) Active Problems:   Vertigo   DM (diabetes mellitus) ([facility])   Chronic back pain   PLAN:   Sepsis:  I suspect his lactic acidosis is from sepsis rather than from metformin.  He also has hypotension and leukocytosis.  Unfortunately, its source is not obvious.  He has been pan-cultured, and will continue with IV Van/Zosyn.  Will continue with IVF.  His UA is negative and his CXR is clear.    DM:  I hope it is not due to metformin that he has lactic acidosis.  Will d/c it for now and use insulin coverage.   Vertigo:  Has improved. Likely peripheral vertigo.  Chronic back pain:  If he has persistent leukocytosis, fever, or persistent evidence of infection, will need MRI of the LS spine to rlout spinal abscess.    DVT prophylaxis: subQ  Heparin.  Code Status: full code.  Family Communication: wife and son at bedside.  Disposition Plan: home.  Consults called: None.  Admission status: inpatient.    Sherrel Ploch MD FACP. [facility]  If 7PM-7AM, please contact night-coverage [email or web address] Password [facility]  09/10/2016, 3:45 AM

## 2016-09-10 NOTE — Progress Notes (Signed)
PROGRESS NOTE    Richard Simmons  ZSW:[id number] DOB: August 17, 1945 DOA: 09/09/2016 PCP: Alain Marion, MD    Brief Narrative:  71 year old male presented with vertigo and weakness. Patient is known to have diabetes mellitus type 2, neuropathy, chronic back pain on narcotics. Apparently he was suffering from an exacerbation of his back pain, decided to lay down on the floor, appropriately he felt dizzy and lightheaded his wife noticed him to be pale and clammy, he was brought into the hospital for further evaluation. Denied any prior symptoms no fevers, no chills, no diarrhea. On physical examination blood pressure 107/59, 81/53, 91/54. heart rate 88-109, respiratory rate 16, oxygen saturation 100%, his mucous membranes were moist, his lungs were clear to auscultation bilaterally, heart S1-S2 present and rhythmic, his abdomen soft nontender, no lower extremity edema and no rashes. Sodium 140, potassium 4.7, chloride 107, bicarbonate 24, glucose 194, BUN 17, creatinine 1.03, AST 20, ALT 13, lactic acid 4.5, white count 21.9, hemoglobin 9.0, hematocrit 29.0, platelets 283, urinalysis negative for infection, urine drug screen positive for opiates, chest x-ray hypoinflated, no effusions, infiltrates or pneumothorax, EKG was sinus rhythm, left axis deviation, poor R-wave progression.  Patient was admitted to hospital with working diagnosis hypotension, systemic inflammatory response syndrome, rule out systemic infection/sepsis.   Assessment & Plan:   Principal Problem:   Sepsis ([place]) Active Problems:   Vertigo   DM (diabetes mellitus) (HCC)   Chronic back pain   1. SIRS. Extensive workup in the ED, no signs of systemic infection, patient has remained afebrile, will hold on antibiotic therapy and will continue to follow on cultures, cell count and temperature curve. Continue gentle IV fluids.  2. Hypotension.  MAP has remained above 65, systolic blood pressure this am 115. Will continue IV isotonic  saline and will continue close telemetry monitoring. EKG with no ischemic changes, if recurrent hypotension will get echocardiography. Questionable medications or vagal mediated hypotension.   3. Acute on chronic back pain. Will continue pain control with codeine, cloanzepam and citalopram. Out of bed as tolerated.   4. T2DM. Will continue glucose cover and monitoring, capillary glucose, 160, 151, 146. Will change fluids with only saline, continue to hold on metformin.   5. Hyperlactatemia, No frank acidosis by indirect evaluation on metabolic panel. Will check lactic acid this pm. No signs of tissue hypoperfusion, likely type B lactic acid elevation.    DVT prophylaxis: enoxaparin Code Status: full  Family Communication: I spoke with patient's family at the bedside and all questions were addressed.  Disposition Plan: home   Consultants:     Procedures:     Antimicrobials:   Zosyn and Vancomycin (dc 07/19)   Subjective: Feeling better, no nausea or vomiting, back pain with improved control, no chest pain, no dyspnea, no rash or trick bites.   Objective: Vitals:   09/10/16 0545 09/10/16 0600 09/10/16 0615 09/10/16 0700  BP: (!) 110/52 (!) 98/50 (!) 97/54 (!) 115/46  Pulse:  85  85  Resp:  17  14  Temp:    98.8 F (37.1 C)  TempSrc:    Oral  SpO2:  94%  95%  Weight:      Height:        Intake/Output Summary (Last 24 hours) at 09/10/16 0836 Last data filed at 09/10/16 0700  Gross per 24 hour  Intake             4550 ml  Output  0 ml  Net             4550 ml   Filed Weights   09/09/16 2306 09/10/16 0454  Weight: 98 kg (216 lb) 97.8 kg (215 lb 9.8 oz)    Examination:  General exam: deconditioned E ENT: mild pallor, no icterus, oral mucosa moist.  Respiratory system: Clear to auscultation. Respiratory effort normal. No wheezing, rales or rhonchi.  Cardiovascular system: S1 & S2 heard, RRR. No JVD, murmurs, rubs, gallops or clicks. No pedal  edema. Gastrointestinal system: Abdomen is protuberant but nondistended, soft and nontender. No organomegaly or masses felt. Normal bowel sounds heard. Central nervous system: Alert and oriented. No focal neurological deficits. Extremities: Symmetric 5 x 5 power. Skin: No rashes, lesions or ulcers      Data Reviewed: I have personally reviewed following labs and imaging studies  CBC:  Recent Labs Lab 09/09/16 2337  WBC 21.9*  NEUTROABS 16.8*  HGB 9.0*  HCT 29.0*  MCV 73.0*  PLT 423   Basic Metabolic Panel:  Recent Labs Lab 09/09/16 2337  NA 140  K 4.7  CL 107  CO2 24  GLUCOSE 194*  BUN 17  CREATININE 1.03  CALCIUM 8.1*   GFR: Estimated Creatinine Clearance: 78.2 mL/min (by C-G formula based on SCr of 1.03 mg/dL). Liver Function Tests:  Recent Labs Lab 09/09/16 2337  AST 20  ALT 13*  ALKPHOS 69  BILITOT 0.4  PROT 5.4*  ALBUMIN 2.7*    Recent Labs Lab 09/09/16 2337  LIPASE 19   No results for input(s): AMMONIA in the last 168 hours. Coagulation Profile: No results for input(s): INR, PROTIME in the last 168 hours. Cardiac Enzymes:  Recent Labs Lab 09/09/16 2337  TROPONINI 0.04*   BNP (last 3 results) No results for input(s): PROBNP in the last 8760 hours. HbA1C: No results for input(s): HGBA1C in the last 72 hours. CBG:  Recent Labs Lab 09/10/16 0525 09/10/16 0729  GLUCAP 160* 151*   Lipid Profile: No results for input(s): CHOL, HDL, LDLCALC, TRIG, CHOLHDL, LDLDIRECT in the last 72 hours. Thyroid Function Tests:  Recent Labs  09/10/16 0024  TSH 2.011   Anemia Panel: No results for input(s): VITAMINB12, FOLATE, FERRITIN, TIBC, IRON, RETICCTPCT in the last 72 hours. Sepsis Labs:  Recent Labs Lab 09/09/16 2356 09/10/16 0301  LATICACIDVEN 4.56* 4.76*    Recent Results (from the past 240 hour(s))  Blood Culture (routine x 2)     Status: None (Preliminary result)   Collection Time: 09/10/16 12:16 AM  Result Value Ref Range  Status   Specimen Description BLOOD LEFT ARM  Final   Special Requests   Final    BOTTLES DRAWN AEROBIC AND ANAEROBIC Blood Culture adequate volume   Culture NO GROWTH < 12 HOURS  Final   Report Status PENDING  Incomplete  Blood Culture (routine x 2)     Status: None (Preliminary result)   Collection Time: 09/10/16 12:24 AM  Result Value Ref Range Status   Specimen Description BLOOD LEFT ARM  Final   Special Requests   Final    BOTTLES DRAWN AEROBIC AND ANAEROBIC Blood Culture adequate volume   Culture NO GROWTH < 12 HOURS  Final   Report Status PENDING  Incomplete  MRSA PCR Screening     Status: None   Collection Time: 09/10/16  4:49 AM  Result Value Ref Range Status   MRSA by PCR NEGATIVE NEGATIVE Final    Comment:  The GeneXpert MRSA Assay (FDA approved for NASAL specimens only), is one component of a comprehensive MRSA colonization surveillance program. It is not intended to diagnose MRSA infection nor to guide or monitor treatment for MRSA infections.          Radiology Studies: Dg Chest Port 1 View  Result Date: 09/10/2016 CLINICAL DATA:  Weakness EXAM: PORTABLE CHEST 1 VIEW COMPARISON:  None. FINDINGS: There is cardiomegaly without pulmonary edema or focal consolidation. No pleural effusion or pneumothorax. IMPRESSION: Cardiomegaly without active cardiopulmonary disease. Electronically Signed   By: Ulyses Jarred M.D.   On: 09/10/2016 00:12        Scheduled Meds: . citalopram  40 mg Oral Daily  . clonazePAM  0.5 mg Oral QHS  . enoxaparin (LOVENOX) injection  40 mg Subcutaneous Q24H  . gabapentin  400 mg Oral TID  . insulin aspart  0-15 Units Subcutaneous TID WC  . insulin aspart  0-5 Units Subcutaneous QHS  . sodium chloride flush  3 mL Intravenous Q12H   Continuous Infusions: . dextrose 5 % and 0.9% NaCl 100 mL (09/10/16 0500)  . piperacillin-tazobactam (ZOSYN)  IV    . vancomycin       LOS: 0 days       Mauricio Gerome Apley, MD [facility] Pager [phone]  If 7PM-7AM, please contact night-coverage [email or web address] Password [facility] 09/10/2016, 8:36 AM

## 2016-09-11 ENCOUNTER — Encounter (HOSPITAL_COMMUNITY): Payer: Self-pay | Admitting: Anesthesiology

## 2016-09-11 DIAGNOSIS — D649 Anemia, unspecified: Secondary | ICD-10-CM

## 2016-09-11 DIAGNOSIS — I959 Hypotension, unspecified: Secondary | ICD-10-CM

## 2016-09-11 DIAGNOSIS — K921 Melena: Secondary | ICD-10-CM

## 2016-09-11 DIAGNOSIS — A419 Sepsis, unspecified organism: Secondary | ICD-10-CM

## 2016-09-11 DIAGNOSIS — K922 Gastrointestinal hemorrhage, unspecified: Secondary | ICD-10-CM

## 2016-09-11 LAB — CBC WITH DIFFERENTIAL/PLATELET
BASOS ABS: 0 10*3/uL (ref 0.0–0.1)
BASOS PCT: 0 %
Basophils Absolute: 0 10*3/uL (ref 0.0–0.1)
Basophils Relative: 0 %
EOS PCT: 2 %
EOS PCT: 2 %
Eosinophils Absolute: 0.2 10*3/uL (ref 0.0–0.7)
Eosinophils Absolute: 0.2 10*3/uL (ref 0.0–0.7)
HCT: 22.2 % — ABNORMAL LOW (ref 39.0–52.0)
HCT: 23.3 % — ABNORMAL LOW (ref 39.0–52.0)
Hemoglobin: 7 g/dL — ABNORMAL LOW (ref 13.0–17.0)
Hemoglobin: 7.3 g/dL — ABNORMAL LOW (ref 13.0–17.0)
LYMPHS ABS: 2.9 10*3/uL (ref 0.7–4.0)
LYMPHS PCT: 27 %
LYMPHS PCT: 25 %
Lymphs Abs: 2.6 10*3/uL (ref 0.7–4.0)
MCH: 22.8 pg — AB (ref 26.0–34.0)
MCH: 22.7 pg — ABNORMAL LOW (ref 26.0–34.0)
MCHC: 31.5 g/dL (ref 30.0–36.0)
MCHC: 31.3 g/dL (ref 30.0–36.0)
MCV: 72.3 fL — AB (ref 78.0–100.0)
MCV: 72.4 fL — AB (ref 78.0–100.0)
MONO ABS: 0.8 10*3/uL (ref 0.1–1.0)
Monocytes Absolute: 0.8 10*3/uL (ref 0.1–1.0)
Monocytes Relative: 8 %
Monocytes Relative: 7 %
Neutro Abs: 6.8 10*3/uL (ref 1.7–7.7)
Neutro Abs: 7 10*3/uL (ref 1.7–7.7)
Neutrophils Relative %: 63 %
Neutrophils Relative %: 66 %
PLATELETS: 205 10*3/uL (ref 150–400)
PLATELETS: 215 10*3/uL (ref 150–400)
RBC: 3.07 MIL/uL — AB (ref 4.22–5.81)
RBC: 3.22 MIL/uL — AB (ref 4.22–5.81)
RDW: 18.9 % — AB (ref 11.5–15.5)
RDW: 19 % — ABNORMAL HIGH (ref 11.5–15.5)
WBC: 10.8 10*3/uL — ABNORMAL HIGH (ref 4.0–10.5)
WBC: 10.6 10*3/uL — AB (ref 4.0–10.5)

## 2016-09-11 LAB — BASIC METABOLIC PANEL
Anion gap: 6 (ref 5–15)
BUN: 26 mg/dL — AB (ref 6–20)
CO2: 22 mmol/L (ref 22–32)
Calcium: 7.8 mg/dL — ABNORMAL LOW (ref 8.9–10.3)
Chloride: 108 mmol/L (ref 101–111)
Creatinine, Ser: 0.68 mg/dL (ref 0.61–1.24)
GFR calc Af Amer: >60 mL/min (ref >60)
GLUCOSE: 149 mg/dL — AB (ref 65–99)
POTASSIUM: 4.2 mmol/L (ref 3.5–5.1)
Sodium: 136 mmol/L (ref 135–145)

## 2016-09-11 LAB — GLUCOSE, CAPILLARY
GLUCOSE-CAPILLARY: 145 mg/dL — AB (ref 65–99)
GLUCOSE-CAPILLARY: 142 mg/dL — AB (ref 65–99)
Glucose-Capillary: 121 mg/dL — ABNORMAL HIGH (ref 65–99)

## 2016-09-11 LAB — OCCULT BLOOD X 1 CARD TO LAB, STOOL: Fecal Occult Bld: POSITIVE — AB

## 2016-09-11 LAB — ABO/RH: ABO/RH(D): A NEG

## 2016-09-11 LAB — HEMOGLOBIN AND HEMATOCRIT, BLOOD
HEMATOCRIT: 22.7 % — AB (ref 39.0–52.0)
HEMOGLOBIN: 7 g/dL — AB (ref 13.0–17.0)

## 2016-09-11 LAB — URINE CULTURE: CULTURE: NO GROWTH

## 2016-09-11 LAB — PREPARE RBC (CROSSMATCH)

## 2016-09-11 LAB — TRANSFERRIN: Transferrin: 256 mg/dL (ref 180–329)

## 2016-09-11 LAB — FERRITIN: Ferritin: 10 ng/mL — ABNORMAL LOW (ref 24–336)

## 2016-09-11 MED ORDER — SODIUM CHLORIDE 0.9 % IV SOLN
INTRAVENOUS | Status: DC
  Administered 2016-09-11: 23:00:00 via INTRAVENOUS

## 2016-09-11 MED ORDER — SODIUM CHLORIDE 0.9 % IV SOLN
Freq: Once | INTRAVENOUS | Status: AC
  Administered 2016-09-11: 20:00:00 via INTRAVENOUS

## 2016-09-11 MED ORDER — PANTOPRAZOLE SODIUM 40 MG IV SOLR
40.0000 mg | Freq: Two times a day (BID) | INTRAVENOUS | Status: DC
  Administered 2016-09-11 – 2016-09-12 (×3): 40 mg via INTRAVENOUS
  Filled 2016-09-11 (×3): qty 40

## 2016-09-11 NOTE — Progress Notes (Signed)
PROGRESS NOTE    Richard Simmons  IOX:[id number] DOB: 02-Oct-1945 DOA: 09/09/2016 PCP: Alain Marion, MD    Brief Narrative:  71 year old male presented with vertigo and weakness. Patient is known to have diabetes mellitus type 2, neuropathy, chronic back pain on narcotics. Apparently he was suffering from an exacerbation of his back pain, decided to lay down on the floor, appropriately he felt dizzy and lightheaded his wife noticed him to be pale and clammy, he was brought into the hospital for further evaluation. Denied any prior symptoms no fevers, no chills, no diarrhea. On physical examination blood pressure 107/59, 81/53, 91/54. heart rate 88-109, respiratory rate 16, oxygen saturation 100%, his mucous membranes were moist, his lungs were clear to auscultation bilaterally, heart S1-S2 present and rhythmic, his abdomen soft nontender, no lower extremity edema and no rashes. Sodium 140, potassium 4.7, chloride 107, bicarbonate 24, glucose 194, BUN 17, creatinine 1.03, AST 20, ALT 13, lactic acid 4.5, white count 21.9, hemoglobin 9.0, hematocrit 29.0, platelets 283, urinalysis negative for infection, urine drug screen positive for opiates, chest x-ray hypoinflated, no effusions, infiltrates or pneumothorax, EKG was sinus rhythm, left axis deviation, poor R-wave progression.  Patient was admitted to hospital with working diagnosis hypotension, systemic inflammatory response syndrome, rule out systemic infection/sepsis.   Assessment & Plan:   Principal Problem:   Sepsis ([place]) Active Problems:   Vertigo   DM (diabetes mellitus) (HCC)   Chronic back pain  1. NEW Acute blood loss anemia, suspected upper GI bleed. HB down to 7, patient with positive dark stools, hemoccult positive, will transfuse one unit prbc, and will continue antiacid therapy with IV protonix. Will need GI consultation for possible endoscopic procedure. Check iron stores.   2. SIRS.  No signs of infection, will continue to  hold on antibiotic therapy. WBC down to 10, patient afebrile. Sepsis has been ruled out.  3. Hypotension. Blood pressure has remained stable, systolic 268 to 341, will hold on IV fluids and will transfuse one unit of PRBC.   4. Acute on chronic back pain. Controlled with codeine, cloanzepam and citalopram.   5. T2DM. Glucose cover and monitoring with sliding scale, capillary glucose, 145, 130, 145, 142. Continue to hold on metformin for now.   6. Hyperlactatemia, Likely type B lactic acid elevation. Patient hemodynamically stable.    DVT prophylaxis: enoxaparin Code Status: full  Family Communication: I spoke with patient's family at the bedside and all questions were addressed.  Disposition Plan: home   Consultants:   GI  Procedures:     Antimicrobials:   Zosyn and Vancomycin (dc 07/19)   Subjective: Patient with no chest pain or dyspnea, positive dark watery stools. No nausea or vomiting.   Objective: Vitals:   09/10/16 1146 09/10/16 1520 09/10/16 2044 09/11/16 0443  BP:  (!) 134/54 (!) 133/55 (!) 141/50  Pulse:  89 87 86  Resp:  20 18 18   Temp: 99.2 F (37.3 C) 97.6 F (36.4 C) 98.7 F (37.1 C) 98.2 F (36.8 C)  TempSrc: Oral Oral Oral Oral  SpO2:  98% 97% 99%  Weight:      Height:        Intake/Output Summary (Last 24 hours) at 09/11/16 1453 Last data filed at 09/11/16 1300  Gross per 24 hour  Intake          1391.25 ml  Output                0 ml  Net  1391.25 ml   Filed Weights   09/09/16 2306 09/10/16 0454  Weight: 98 kg (216 lb) 97.8 kg (215 lb 9.8 oz)    Examination:  General exam: not in pain or dyspnea E ENT: mild pallor, no icterus, oral mucosa moist.   Respiratory system: Clear to auscultation. Respiratory effort normal. No wheezing, rales or rhonchi Cardiovascular system: S1 & S2 heard, RRR. No JVD, murmurs, rubs, gallops or clicks. No pedal edema. Gastrointestinal system: Abdomen is nondistended, soft and nontender.  No organomegaly or masses felt. Normal bowel sounds heard. Central nervous system: Alert and oriented. No focal neurological deficits. Extremities: Symmetric 5 x 5 power. Skin: No rashes, lesions or ulcers     Data Reviewed: I have personally reviewed following labs and imaging studies  CBC:  Recent Labs Lab 09/09/16 2337 09/10/16 1008 09/11/16 0701 09/11/16 1011  WBC 21.9* 13.5* 10.8*  --   NEUTROABS 16.8* 10.6* 6.8  --   HGB 9.0* 7.4* 7.0* 7.0*  HCT 29.0* 23.7* 22.2* 22.7*  MCV 73.0* 73.1* 72.3*  --   PLT 283 214 205  --    Basic Metabolic Panel:  Recent Labs Lab 09/09/16 2337 09/10/16 1008 09/11/16 0701  NA 140 135 136  K 4.7 4.7 4.2  CL 107 106 108  CO2 24 23 22   GLUCOSE 194* 156* 149*  BUN 17 26* 26*  CREATININE 1.03 0.80 0.68  CALCIUM 8.1* 7.4* 7.8*   GFR: Estimated Creatinine Clearance: 100.7 mL/min (by C-G formula based on SCr of 0.68 mg/dL). Liver Function Tests:  Recent Labs Lab 09/09/16 2337  AST 20  ALT 13*  ALKPHOS 69  BILITOT 0.4  PROT 5.4*  ALBUMIN 2.7*    Recent Labs Lab 09/09/16 2337  LIPASE 19   No results for input(s): AMMONIA in the last 168 hours. Coagulation Profile: No results for input(s): INR, PROTIME in the last 168 hours. Cardiac Enzymes:  Recent Labs Lab 09/09/16 2337  TROPONINI 0.04*   BNP (last 3 results) No results for input(s): PROBNP in the last 8760 hours. HbA1C: No results for input(s): HGBA1C in the last 72 hours. CBG:  Recent Labs Lab 09/10/16 1114 09/10/16 1714 09/10/16 2234 09/11/16 0745 09/11/16 1212  GLUCAP 146* 145* 130* 145* 142*   Lipid Profile: No results for input(s): CHOL, HDL, LDLCALC, TRIG, CHOLHDL, LDLDIRECT in the last 72 hours. Thyroid Function Tests:  Recent Labs  09/10/16 0024  TSH 2.011   Anemia Panel: No results for input(s): VITAMINB12, FOLATE, FERRITIN, TIBC, IRON, RETICCTPCT in the last 72 hours. Sepsis Labs:  Recent Labs Lab 09/09/16 2356 09/10/16 0301  09/10/16 1618 09/10/16 1736  LATICACIDVEN 4.56* 4.76* 1.9 2.3*    Recent Results (from the past 240 hour(s))  Blood Culture (routine x 2)     Status: None (Preliminary result)   Collection Time: 09/10/16 12:16 AM  Result Value Ref Range Status   Specimen Description BLOOD LEFT ARM  Final   Special Requests   Final    BOTTLES DRAWN AEROBIC AND ANAEROBIC Blood Culture adequate volume   Culture NO GROWTH 1 DAY  Final   Report Status PENDING  Incomplete  Blood Culture (routine x 2)     Status: None (Preliminary result)   Collection Time: 09/10/16 12:24 AM  Result Value Ref Range Status   Specimen Description BLOOD LEFT ARM  Final   Special Requests   Final    BOTTLES DRAWN AEROBIC AND ANAEROBIC Blood Culture adequate volume   Culture NO GROWTH 1 DAY  Final   Report Status PENDING  Incomplete  Urine culture     Status: None   Collection Time: 09/10/16  3:00 AM  Result Value Ref Range Status   Specimen Description URINE, RANDOM  Final   Special Requests NONE  Final   Culture   Final    NO GROWTH Performed at [facility], [place] [address] [place], [place] [address]    Report Status 09/11/2016 FINAL  Final  MRSA PCR Screening     Status: None   Collection Time: 09/10/16  4:49 AM  Result Value Ref Range Status   MRSA by PCR NEGATIVE NEGATIVE Final    Comment:        The GeneXpert MRSA Assay (FDA approved for NASAL specimens only), is one component of a comprehensive MRSA colonization surveillance program. It is not intended to diagnose MRSA infection nor to guide or monitor treatment for MRSA infections.          Radiology Studies: Dg Chest Port 1 View  Result Date: 09/10/2016 CLINICAL DATA:  Weakness EXAM: PORTABLE CHEST 1 VIEW COMPARISON:  None. FINDINGS: There is cardiomegaly without pulmonary edema or focal consolidation. No pleural effusion or pneumothorax. IMPRESSION: Cardiomegaly without active cardiopulmonary disease. Electronically Signed   By: Ulyses Jarred M.D.   On: 09/10/2016 00:12        Scheduled Meds: . citalopram  40 mg Oral Daily  . clonazePAM  0.5 mg Oral QHS  . enoxaparin (LOVENOX) injection  40 mg Subcutaneous Q24H  . gabapentin  400 mg Oral TID  . insulin aspart  0-15 Units Subcutaneous TID WC  . insulin aspart  0-5 Units Subcutaneous QHS  . sodium chloride flush  3 mL Intravenous Q12H   Continuous Infusions:   LOS: 1 day      Naudia Crosley Gerome Apley, MD [facility] Pager [phone]  If 7PM-7AM, please contact night-coverage [email or web address] Password [email or web address] 09/11/2016, 2:53 PM

## 2016-09-11 NOTE — Progress Notes (Signed)
Per patient's wife, pt has had 4-5 loose bowel movements overnight that were brownish/black. BMs unwitnessed by nurse. Requested pt to save next BM for nurse to see.

## 2016-09-11 NOTE — Consult Note (Signed)
Referring Provider: No ref. provider found Primary Care Physician:  Alain Marion, MD Primary Gastroenterologist:  Dr. Gala Romney (previously unassigned)  Date of Admission: 09/09/16 Date of Consultation: 09/11/16  Reason for Consultation:  GI bleed, anemia  HPI:  Richard Simmons is a 71 y.o. male with a past medical history of chronic back pain, diabetes, neuropathy, hearing loss. The patient was admitted with a fall after vertigo and weakness when his wife noted him to be pale and clammy. Per hospitalist noted he denied prior symptoms including fever, chills, diarrhea. His blood pressure was soft, minimally tachycardic. On admission he did have an elevated white blood cell count of 21.9, low hemoglobin 9.0 with no available baseline, platelets 283. He was admitted to hospital for further evaluation and treatment for hypertension, systemic inflammatory response syndrome, rule out systemic infection/sepsis. He underwent pan culture, started on antibiotics. Antibiotics placed on hold due to afebrile in no signs of systemic infection despite extensive workup. He was given IV fluid resuscitation with improvement in blood pressure and his map has remained above 65.  Per nurse's notes the patient's wife said the patient had 4-5 loose bowel movements overnight in the hospital which were brownish/black, although bowel movements were unwitnessed by the nurse. Hemoglobin this morning was 7.0 which was confirmed on repeat a few hours later. Iron studies and progress. Noted to be heme positive on occult blood card (negative 2 days ago). We have been asked to see for GI bleed and anemia.  Today he states he is not having any abdominal pain. Before coming to the hospital his stools were mostly brown, but has seen little specks of black (although he doesn't thoroughly look). He has never had a colonoscopy or endoscopy before,. He noted stools last night that were black and loose. Denies chest pain, N/V, dyspnea,  dizziness, lightheadedness. He denies NSAIDs. Admits regular use of ASA powders (typically every day, last use the night he presented to the ER). No other GI symptoms at this time.   Past Medical History:  Diagnosis Date  . Chronic back pain   . Diabetes mellitus without complication ([place])   . Hearing loss   . Neuropathy     No past surgical history on file.  Prior to Admission medications   Medication Sig Start Date End Date Taking? Authorizing Provider  amitriptyline (ELAVIL) 25 MG tablet Take 25 mg by mouth at bedtime. Three tablets by mouth at bedtime.   Yes [provider]  citalopram (CELEXA) 40 MG tablet Take 40 mg by mouth daily. Take one-half tablet by mouth once daily.   Yes [provider]  clonazePAM (KLONOPIN) 0.5 MG tablet Take 0.5 mg by mouth at bedtime. Take one tablet by mouth at bedtime for anxiety.   Yes [provider]  codeine 30 MG tablet Take 60 mg by mouth every 8 (eight) hours as needed. Take two tablets by mouth every 8 hrs prn.    Yes [provider]  gabapentin (NEURONTIN) 400 MG capsule Take 400 mg by mouth 3 (three) times daily. Two tablets by mouth three times a day.   Yes [provider]  metFORMIN (GLUCOPHAGE) 850 MG tablet Take 850 mg by mouth 2 (two) times daily with a meal.   Yes [provider]    Current Facility-Administered Medications  Medication Dose Route Frequency Provider Last Rate Last Dose  . 0.9 %  sodium chloride infusion   Intravenous Once Arrien, Jimmy Picket, MD      . acetaminophen (  TYLENOL) tablet 650 mg  650 mg Oral Q6H PRN Orvan Falconer, MD       Or  . acetaminophen (TYLENOL) suppository 650 mg  650 mg Rectal Q6H PRN Orvan Falconer, MD      . citalopram (CELEXA) tablet 40 mg  40 mg Oral Daily Orvan Falconer, MD   40 mg at 09/11/16 0853  . clonazePAM (KLONOPIN) tablet 0.5 mg  0.5 mg Oral QHS Orvan Falconer, MD   0.5 mg at 09/10/16 2241  . codeine tablet 60 mg  60 mg Oral Q4H PRN Orvan Falconer, MD    60 mg at 09/10/16 2241  . enoxaparin (LOVENOX) injection 40 mg  40 mg Subcutaneous Q24H Orvan Falconer, MD   40 mg at 09/10/16 0600  . gabapentin (NEURONTIN) capsule 400 mg  400 mg Oral TID Orvan Falconer, MD   400 mg at 09/11/16 0853  . insulin aspart (novoLOG) injection 0-15 Units  0-15 Units Subcutaneous TID WC Orvan Falconer, MD   2 Units at 09/11/16 1224  . insulin aspart (novoLOG) injection 0-5 Units  0-5 Units Subcutaneous QHS Orvan Falconer, MD      . ondansetron [facility]) tablet 4 mg  4 mg Oral Q6H PRN Orvan Falconer, MD       Or  . ondansetron [facility]) injection 4 mg  4 mg Intravenous Q6H PRN Orvan Falconer, MD      . sodium chloride flush (NS) 0.9 % injection 3 mL  3 mL Intravenous Q12H Orvan Falconer, MD   3 mL at 09/11/16 0853    Allergies as of 09/09/2016  . (No Known Allergies)    History reviewed. No pertinent family history.  Social History   Social History  . Marital status: Married    Spouse name: N/A  . Number of children: N/A  . Years of education: N/A   Occupational History  . Not on file.   Social History Main Topics  . Smoking status: Never Smoker  . Smokeless tobacco: Current User  . Alcohol use No  . Drug use: No  . Sexual activity: Not on file   Other Topics Concern  . Not on file   Social History Narrative  . No narrative on file    Review of Systems: General: Negative for anorexia, weight loss, fever, chills, fatigue, weakness. ENT: Negative for hoarseness, difficulty swallowing. CV: Negative for chest pain, angina, palpitations, peripheral edema.  Respiratory: Negative for dyspnea at rest, cough, sputum, wheezing.  GI: See history of present illness. MS: Admits chronic back pain.  Derm: Negative for rash or itching.  Neuro: Admits chronic neuropathy.  Endo: Negative for unusual weight change.  Heme: Negative for bruising or bleeding.  Physical Exam: Vital signs in last 24 hours: Temp:  [97.6 F (36.4 C)-98.7 F (37.1 C)] 98.2 F (36.8 C) (07/20 0443) Pulse Rate:   [86-89] 86 (07/20 0443) Resp:  [18-20] 18 (07/20 0443) BP: (133-141)/(50-55) 141/50 (07/20 0443) SpO2:  [97 %-99 %] 99 % (07/20 0443) Last BM Date: 09/11/16 General:   Alert,  Well-developed, well-nourished, pleasant and cooperative in NAD Head:  Normocephalic and atraumatic. Eyes:  Sclera clear, no icterus. Conjunctiva pink. Ears:  Normal auditory acuity. Neck:  Supple; no masses or thyromegaly. Lungs:  Clear throughout to auscultation. No wheezes, crackles, or rhonchi. No acute distress. Heart:  Regular rate and rhythm; no murmurs, clicks, rubs,  or gallops. Abdomen:  Rounded but soft, nontender and nondistended. No masses, hepatosplenomegaly or hernias noted. Normal bowel sounds, without guarding, and without  rebound.   Rectal:  Deferred.   Msk:  Symmetrical without gross deformities. Pulses:  Normal pulses noted. Extremities:  Without clubbing or edema. Neurologic:  Alert and  oriented x4;  grossly normal neurologically. Psych:  Alert and cooperative. Normal mood and affect.  Intake/Output from previous day: 07/19 0701 - 07/20 0700 In: 1031.3 [P.O.:120; I.V.:911.3] Out: 250 [Urine:250] Intake/Output this shift: Total I/O In: 480 [P.O.:480] Out: -   Lab Results:  Recent Labs  09/09/16 2337 09/10/16 1008 09/11/16 0701 09/11/16 1011  WBC 21.9* 13.5* 10.8*  --   HGB 9.0* 7.4* 7.0* 7.0*  HCT 29.0* 23.7* 22.2* 22.7*  PLT 283 214 205  --    BMET  Recent Labs  09/09/16 2337 09/10/16 1008 09/11/16 0701  NA 140 135 136  K 4.7 4.7 4.2  CL 107 106 108  CO2 24 23 22   GLUCOSE 194* 156* 149*  BUN 17 26* 26*  CREATININE 1.03 0.80 0.68  CALCIUM 8.1* 7.4* 7.8*   LFT  Recent Labs  09/09/16 2337  PROT 5.4*  ALBUMIN 2.7*  AST 20  ALT 13*  ALKPHOS 69  BILITOT 0.4   PT/INR No results for input(s): LABPROT, INR in the last 72 hours. Hepatitis Panel No results for input(s): HEPBSAG, HCVAB, HEPAIGM, HEPBIGM in the last 72 hours. C-Diff No results for input(s):  CDIFFTOX in the last 72 hours.  Studies/Results: Dg Chest Port 1 View  Result Date: 09/10/2016 CLINICAL DATA:  Weakness EXAM: PORTABLE CHEST 1 VIEW COMPARISON:  None. FINDINGS: There is cardiomegaly without pulmonary edema or focal consolidation. No pleural effusion or pneumothorax. IMPRESSION: Cardiomegaly without active cardiopulmonary disease. Electronically Signed   By: Ulyses Jarred M.D.   On: 09/10/2016 00:12    Impression: Pleasant 71 year old gentleman who is admitted for systemic inflammatory response syndrome, lactic acidosis, hypotension, and presumed infection. Antibiotics initially started but then discontinued due to no source of infection and afebrile. We were consulted for anemia. On admission his hemoglobin was 9.0 and has subsequently dropped to 7.0 this morning which was confirmed on recheck 3 hours later. His wife noted black stools which were loose last night. The patient notes possibly some are slightly black stools previous to admission. Denies NSAIDs but does endorse daily aspirin powder use, last use occurred night before his admission. Denies abdominal pain. Not currently on daily aspirin. He is not on an acid blocker at home. Differentials include esophagitis, gastritis, gastric ulcer, gastric erosion, duodenal erosion, duodenitis, duodenal ulcer. Most likely source for his bleeding is upper GI. Given his drop in hemoglobin he would likely benefit from upper endoscopically to further evaluate and potentially treat any lesions that are treatable. No indication for urgent colonoscopy at this time. I discussed with him the need to undergo colonoscopy for routine screening and he states he can have this done at the [place] and we'll pursue it as an outpatient.  Plan: 1. Continue Protonix twice daily 2. Discussed with Dr. Gala Romney: Will add on for EGD tomorrow with Dr. Laural Golden 3. NPO after midnight 4. Monitor for any recurrent GI bleed 5. Check Hgb tomorrow 6. Transfuse as  necessary 7. Supportive measures   Thank you for allowing [place] to participate in the care of Lulu Riding, DNP, AGNP-C Adult & Gerontological Nurse Practitioner [facility]   ADDENDUM: Discussed with Dr. Laural Golden to notify of add on. Patient will need propofol due to polypharmacy. Dr. Laural Golden aware. Supervisor notified to inform call teal.    LOS: 1  day     09/11/2016, 3:13 PM

## 2016-09-12 ENCOUNTER — Encounter (HOSPITAL_COMMUNITY)
Admission: EM | Disposition: A | Discharge status: Home / Self Care | Payer: Self-pay | Source: Home / Self Care | Attending: Internal Medicine

## 2016-09-12 ENCOUNTER — Encounter (HOSPITAL_COMMUNITY): Payer: Self-pay | Admitting: *Deleted

## 2016-09-12 ENCOUNTER — Inpatient Hospital Stay (HOSPITAL_COMMUNITY): Payer: Medicare Other | Admitting: Anesthesiology

## 2016-09-12 DIAGNOSIS — I959 Hypotension, unspecified: Secondary | ICD-10-CM

## 2016-09-12 DIAGNOSIS — K269 Duodenal ulcer, unspecified as acute or chronic, without hemorrhage or perforation: Secondary | ICD-10-CM

## 2016-09-12 DIAGNOSIS — R571 Hypovolemic shock: Secondary | ICD-10-CM

## 2016-09-12 DIAGNOSIS — K254 Chronic or unspecified gastric ulcer with hemorrhage: Secondary | ICD-10-CM

## 2016-09-12 DIAGNOSIS — K922 Gastrointestinal hemorrhage, unspecified: Secondary | ICD-10-CM

## 2016-09-12 HISTORY — PX: ESOPHAGOGASTRODUODENOSCOPY (EGD) WITH PROPOFOL: SHX5813

## 2016-09-12 LAB — CBC WITH DIFFERENTIAL/PLATELET
Basophils Absolute: 0 10*3/uL (ref 0.0–0.1)
Basophils Relative: 0 %
EOS ABS: 0.3 10*3/uL (ref 0.0–0.7)
EOS PCT: 3 %
HCT: 26.9 % — ABNORMAL LOW (ref 39.0–52.0)
HEMOGLOBIN: 8.6 g/dL — AB (ref 13.0–17.0)
LYMPHS ABS: 2.5 10*3/uL (ref 0.7–4.0)
Lymphocytes Relative: 27 %
MCH: 23.8 pg — AB (ref 26.0–34.0)
MCHC: 32 g/dL (ref 30.0–36.0)
MCV: 74.5 fL — ABNORMAL LOW (ref 78.0–100.0)
MONOS PCT: 8 %
Monocytes Absolute: 0.8 10*3/uL (ref 0.1–1.0)
Neutro Abs: 5.9 10*3/uL (ref 1.7–7.7)
Neutrophils Relative %: 62 %
PLATELETS: 222 10*3/uL (ref 150–400)
RBC: 3.61 MIL/uL — ABNORMAL LOW (ref 4.22–5.81)
RDW: 18.8 % — ABNORMAL HIGH (ref 11.5–15.5)
WBC: 9.5 10*3/uL (ref 4.0–10.5)

## 2016-09-12 LAB — BASIC METABOLIC PANEL
Anion gap: 8 (ref 5–15)
BUN: 16 mg/dL (ref 6–20)
CHLORIDE: 107 mmol/L (ref 101–111)
CO2: 23 mmol/L (ref 22–32)
CREATININE: 0.72 mg/dL (ref 0.61–1.24)
Calcium: 8.2 mg/dL — ABNORMAL LOW (ref 8.9–10.3)
GFR calc Af Amer: >60 mL/min (ref >60)
GFR calc non Af Amer: >60 mL/min (ref >60)
GLUCOSE: 134 mg/dL — AB (ref 65–99)
Potassium: 3.9 mmol/L (ref 3.5–5.1)
SODIUM: 138 mmol/L (ref 135–145)

## 2016-09-12 LAB — HEMOGLOBIN AND HEMATOCRIT, BLOOD
HEMATOCRIT: 25.7 % — AB (ref 39.0–52.0)
HEMOGLOBIN: 8.2 g/dL — AB (ref 13.0–17.0)

## 2016-09-12 LAB — IRON AND TIBC
IRON: 33 ug/dL — AB (ref 45–182)
Saturation Ratios: 8 % — ABNORMAL LOW (ref 17.9–39.5)
TIBC: 402 ug/dL (ref 250–450)
UIBC: 369 ug/dL

## 2016-09-12 LAB — CBC
HEMATOCRIT: 24.7 % — AB (ref 39.0–52.0)
HEMOGLOBIN: 8.4 g/dL — AB (ref 13.0–17.0)
MCH: 26.3 pg (ref 26.0–34.0)
MCHC: 34 g/dL (ref 30.0–36.0)
MCV: 77.2 fL — ABNORMAL LOW (ref 78.0–100.0)
Platelets: 181 10*3/uL (ref 150–400)
RBC: 3.2 MIL/uL — ABNORMAL LOW (ref 4.22–5.81)
RDW: 17.4 % — ABNORMAL HIGH (ref 11.5–15.5)
WBC: 15.7 10*3/uL — ABNORMAL HIGH (ref 4.0–10.5)

## 2016-09-12 LAB — GLUCOSE, CAPILLARY
Glucose-Capillary: 145 mg/dL — ABNORMAL HIGH (ref 65–99)
Glucose-Capillary: 214 mg/dL — ABNORMAL HIGH (ref 65–99)
Glucose-Capillary: 140 mg/dL — ABNORMAL HIGH (ref 65–99)
Glucose-Capillary: 192 mg/dL — ABNORMAL HIGH (ref 65–99)
Glucose-Capillary: 134 mg/dL — ABNORMAL HIGH (ref 65–99)

## 2016-09-12 LAB — PREPARE RBC (CROSSMATCH)

## 2016-09-12 LAB — PROTIME-INR
INR: 1.29
Prothrombin Time: 16.2 seconds — ABNORMAL HIGH (ref 11.4–15.2)

## 2016-09-12 SURGERY — ESOPHAGOGASTRODUODENOSCOPY (EGD) WITH PROPOFOL
Anesthesia: General

## 2016-09-12 MED ORDER — SODIUM CHLORIDE 0.9 % IV SOLN: Freq: Once | INTRAVENOUS | Status: DC

## 2016-09-12 MED ORDER — PANTOPRAZOLE SODIUM 40 MG IV SOLR
40.0000 mg | Freq: Two times a day (BID) | INTRAVENOUS | Status: DC
  Filled 2016-09-12: qty 40

## 2016-09-12 MED ORDER — PROPOFOL 10 MG/ML IV BOLUS
INTRAVENOUS | Status: DC | PRN
  Administered 2016-09-12: 50 mg via INTRAVENOUS

## 2016-09-12 MED ORDER — SODIUM CHLORIDE 0.9 % IJ SOLN
INTRAMUSCULAR | Status: AC
  Filled 2016-09-12: qty 10

## 2016-09-12 MED ORDER — SUCCINYLCHOLINE CHLORIDE 20 MG/ML IJ SOLN
INTRAMUSCULAR | Status: AC
  Filled 2016-09-12: qty 1

## 2016-09-12 MED ORDER — SODIUM CHLORIDE 0.9 % IV SOLN
8.0000 mg/h | INTRAVENOUS | Status: AC
  Administered 2016-09-13 – 2016-09-15 (×4): 8 mg/h via INTRAVENOUS
  Filled 2016-09-12 (×10): qty 80

## 2016-09-12 MED ORDER — FENTANYL CITRATE (PF) 100 MCG/2ML IJ SOLN
INTRAMUSCULAR | Status: AC
  Filled 2016-09-12: qty 4

## 2016-09-12 MED ORDER — LIDOCAINE HCL (CARDIAC) 20 MG/ML IV SOLN
INTRAVENOUS | Status: DC | PRN
  Administered 2016-09-12: 30 mg via INTRAVENOUS

## 2016-09-12 MED ORDER — ONDANSETRON HCL 4 MG/2ML IJ SOLN
INTRAMUSCULAR | Status: AC
  Filled 2016-09-12: qty 2

## 2016-09-12 MED ORDER — EPHEDRINE SULFATE 50 MG/ML IJ SOLN
INTRAMUSCULAR | Status: DC | PRN
  Administered 2016-09-12: 10 mg via INTRAVENOUS

## 2016-09-12 MED ORDER — SODIUM CHLORIDE 0.9 % IV BOLUS (SEPSIS): 1000.0000 mL | Freq: Once | INTRAVENOUS | Status: DC

## 2016-09-12 MED ORDER — METOCLOPRAMIDE HCL 5 MG/ML IJ SOLN
10.0000 mg | Freq: Four times a day (QID) | INTRAMUSCULAR | Status: AC
  Administered 2016-09-13 (×3): 10 mg via INTRAVENOUS
  Filled 2016-09-12 (×5): qty 2

## 2016-09-12 MED ORDER — LIDOCAINE HCL (PF) 1 % IJ SOLN
INTRAMUSCULAR | Status: AC
  Filled 2016-09-12: qty 5

## 2016-09-12 MED ORDER — ONDANSETRON HCL 4 MG/2ML IJ SOLN
INTRAMUSCULAR | Status: DC | PRN
  Administered 2016-09-12: 4 mg via INTRAVENOUS

## 2016-09-12 MED ORDER — SODIUM CHLORIDE 0.9 % IJ SOLN
INTRAMUSCULAR | Status: DC | PRN
  Administered 2016-09-12: 4 mL

## 2016-09-12 MED ORDER — SODIUM CHLORIDE 0.9 % IV SOLN
INTRAVENOUS | Status: DC | PRN
  Administered 2016-09-12: 15:00:00 via INTRAVENOUS

## 2016-09-12 MED ORDER — SUCCINYLCHOLINE CHLORIDE 20 MG/ML IJ SOLN
INTRAMUSCULAR | Status: DC | PRN
  Administered 2016-09-12: 140 mg via INTRAVENOUS

## 2016-09-12 MED ORDER — METOCLOPRAMIDE HCL 5 MG/ML IJ SOLN
10.0000 mg | Freq: Once | INTRAMUSCULAR | Status: AC
  Administered 2016-09-12: 10 mg via INTRAVENOUS
  Filled 2016-09-12: qty 2

## 2016-09-12 MED ORDER — SODIUM CHLORIDE 0.9 % IV BOLUS (SEPSIS)
500.0000 mL | Freq: Once | INTRAVENOUS | Status: AC
  Administered 2016-09-12: 500 mL via INTRAVENOUS

## 2016-09-12 MED ORDER — METOCLOPRAMIDE HCL 5 MG/ML IJ SOLN: 10.0000 mg | Freq: Once | INTRAMUSCULAR | Status: DC

## 2016-09-12 MED ORDER — EPHEDRINE SULFATE 50 MG/ML IJ SOLN
INTRAMUSCULAR | Status: AC
  Filled 2016-09-12: qty 1

## 2016-09-12 MED ORDER — ETOMIDATE 2 MG/ML IV SOLN
INTRAVENOUS | Status: AC
  Filled 2016-09-12: qty 20

## 2016-09-12 MED ORDER — SODIUM CHLORIDE 0.9% FLUSH
INTRAVENOUS | Status: AC
  Filled 2016-09-12: qty 10

## 2016-09-12 MED ORDER — PROPOFOL 10 MG/ML IV BOLUS
INTRAVENOUS | Status: AC
  Filled 2016-09-12: qty 20

## 2016-09-12 MED ORDER — PHENYLEPHRINE HCL 10 MG/ML IJ SOLN
INTRAMUSCULAR | Status: DC | PRN
  Administered 2016-09-12 (×4): 80 ug via INTRAVENOUS

## 2016-09-12 MED ORDER — GLYCOPYRROLATE 0.2 MG/ML IJ SOLN
INTRAMUSCULAR | Status: AC
  Filled 2016-09-12: qty 1

## 2016-09-12 MED ORDER — PANTOPRAZOLE SODIUM 40 MG IV SOLR
INTRAVENOUS | Status: AC
  Filled 2016-09-12: qty 160

## 2016-09-12 MED ORDER — LACTATED RINGERS IV SOLN
INTRAVENOUS | Status: DC
  Administered 2016-09-12: 50 mL via INTRAVENOUS

## 2016-09-12 MED ORDER — SODIUM CHLORIDE 0.9 % IV SOLN
80.0000 mg | Freq: Once | INTRAVENOUS | Status: AC
  Administered 2016-09-13: 80 mg via INTRAVENOUS
  Filled 2016-09-12: qty 80

## 2016-09-12 MED ORDER — MIDAZOLAM HCL 2 MG/2ML IJ SOLN
INTRAMUSCULAR | Status: AC
  Filled 2016-09-12: qty 2

## 2016-09-12 MED ORDER — LIDOCAINE VISCOUS 2 % MT SOLN
OROMUCOSAL | Status: AC
  Filled 2016-09-12: qty 15

## 2016-09-12 MED ORDER — PROPOFOL 10 MG/ML IV BOLUS
INTRAVENOUS | Status: AC
  Filled 2016-09-12: qty 40

## 2016-09-12 MED ORDER — FENTANYL CITRATE (PF) 100 MCG/2ML IJ SOLN
INTRAMUSCULAR | Status: DC | PRN
  Administered 2016-09-12 (×2): 50 ug via INTRAVENOUS

## 2016-09-12 MED ORDER — MIDAZOLAM HCL 5 MG/5ML IJ SOLN
INTRAMUSCULAR | Status: DC | PRN
  Administered 2016-09-12: 2 mg via INTRAVENOUS

## 2016-09-12 MED ORDER — ETOMIDATE 2 MG/ML IV SOLN
INTRAVENOUS | Status: DC | PRN
  Administered 2016-09-12: 10 mg via INTRAVENOUS

## 2016-09-12 NOTE — Op Note (Signed)
[facility] Patient Name: Richard Simmons Procedure Date: 09/12/2016 8:00 AM MRN: [id number] Date of Birth: Apr 05, 1945 Attending MD: Hildred Laser , MD CSN: [id number] Age: 71 Admit Type: Outpatient Procedure:                Upper GI endoscopy Indications:              Active gastrointestinal bleeding Providers:                Hildred Laser, MD, Lurline Del, RN, Aram Candela Referring MD:             Tawni Millers, MD Medicines:                General Anesthesia Complications:            No immediate complications. Estimated Blood Loss:     Estimated blood loss: none. Procedure:                Pre-Anesthesia Assessment:                           - Prior to the procedure, a History and Physical                            was performed, and patient medications and                            allergies were reviewed. The patient's tolerance of                            previous anesthesia was also reviewed. The risks                            and benefits of the procedure and the sedation                            options and risks were discussed with the patient.                            All questions were answered, and informed consent                            was obtained. Prior Anticoagulants: The patient                            last took aspirin 2 days prior to the procedure and                            last took Lovenox (enoxaparin) on the day of the                            procedure. ASA Grade Assessment: III - A patient                            with severe systemic disease. After reviewing the  risks and benefits, the patient was deemed in                            satisfactory condition to undergo the procedure.                           After obtaining informed consent, the endoscope was                            passed under direct vision. Throughout the                            procedure, the patient's blood pressure,  pulse, and                            oxygen saturations were monitored continuously. The                            EG-[id number] ([id number]) scope was introduced through the                            mouth, and advanced to the second part of duodenum.                            The upper GI endoscopy was accomplished without                            difficulty. The patient tolerated the procedure                            well. Scope In: 2:52:30 PM Scope Out: 3:19:11 PM Total Procedure Duration: 0 hours 26 minutes 41 seconds  Findings:      The examined esophagus was normal.      The Z-line was regular.      Clotted blood was found in the gastric fundus and in the gastric body.      One non-bleeding cratered gastric ulcer with adherent clot was found at       the incisura. The lesion was 15 mm in largest dimension. Area was       successfully injected with 4 mL of a 1:10,000 solution of epinephrine       for hemostasis. Coagulation for bleeding prevention using heater probe       was successful.      The cardia, gastric fundus, prepyloric region of the stomach and pylorus       were normal.      Two non-bleeding cratered duodenal ulcers with no stigmata of bleeding       were found in the duodenal bulb. The largest lesion was 8 mm in largest       dimension.      The second portion of the duodenum was normal. Impression:               - Normal esophagus.                           - Z-line regular.                           -  Clotted blood as well as fresh blood noted in the                            gastric fundus and in the gastric body. Some of the                            blood and clots were removed using double channel                            scope and suction system                           - Non-bleeding gastric ulcer with adherent clot.                            Injected. Treated with a heater probe.                           - Normal cardia, gastric fundus, prepyloric  region                            of the stomach and pylorus.                           - Multiple non-bleeding duodenal ulcers with no                            stigmata of bleeding.                           - Normal second portion of the duodenum.                           - No specimens collected. Moderate Sedation:      Per Anesthesia Care Recommendation:           - Return patient to ICU for ongoing care.                           - NPO today.                           - Continue present medications.                           - H. Pylori serology.                           - Repeat EGD in 10-12 weeks. Procedure Code(s):        --- Professional ---                           [phone], Esophagogastroduodenoscopy, flexible,                            transoral; with control of bleeding, any method Diagnosis Code(s):        ---  Professional ---                           K92.2, Gastrointestinal hemorrhage, unspecified                           K25.4, Chronic or unspecified gastric ulcer with                            hemorrhage                           K26.9, Duodenal ulcer, unspecified as acute or                            chronic, without hemorrhage or perforation CPT copyright 2016 American Medical Association. All rights reserved. The codes documented in this report are preliminary and upon coder review may  be revised to meet current compliance requirements. Hildred Laser, MD Hildred Laser, MD 09/12/2016 3:45:41 PM This report has been signed electronically. Number of Addenda: 0

## 2016-09-12 NOTE — Transfer of Care (Signed)
Immediate Anesthesia Transfer of Care Note  Patient: Richard Simmons  Procedure(s) Performed: Procedure(s): ESOPHAGOGASTRODUODENOSCOPY (EGD) WITH PROPOFOL (N/A)  Patient Location: ICU  Anesthesia Type:General  Level of Consciousness: awake and confused  Airway & Oxygen Therapy: Patient Spontanous Breathing and Patient connected to face mask oxygen  Post-op Assessment: Report given to RN and Post -op Vital signs reviewed and stable  Post vital signs: Reviewed and stable  Last Vitals:  Vitals:   09/12/16 1435 09/12/16 1558  BP:  (!) 115/57  Pulse: 94 (!) 106  Resp:  17  Temp:  36.7 C    Last Pain:  Vitals:   09/12/16 1558  TempSrc:   PainSc: 0-No pain      Patients Stated Pain Goal: 3 (33/54/56 2563)  Complications: No apparent anesthesia complications

## 2016-09-12 NOTE — Progress Notes (Signed)
Pt transported to ICU room 12 via bed.  Report given to [facility].  Will notify Dr Dereck Leep of pt transfer to ICU.  Family were at bedside and aware of transfer.

## 2016-09-12 NOTE — Progress Notes (Signed)
Patient was scheduled to undergo EGD this morning. When the nursing staff went to pick him up he became hypotensive. His hemoglobin from this morning was 8.6 g. When I went to see the patient he complained of nausea and vomited a few cc of blood. He denied chest or abdominal pain. Abdominal exam was within normal limits. Patient was transferred to ICU for closer monitoring. Patient is receiving a unit of PRBCs. He will undergo EGD this afternoon with monitored anesthesia care or endotracheal intubation. I noted that he had received Lovenox this morning. I have discontinued this medication. Will check posttransfusion H&H and INR.

## 2016-09-12 NOTE — Anesthesia Preprocedure Evaluation (Signed)
Anesthesia Evaluation  Patient identified by MRN, date of birth, ID band Patient awake    Airway Mallampati: II  TM Distance: >3 FB     Dental  (+) Edentulous Upper, Edentulous Lower   Pulmonary    breath sounds clear to auscultation       Cardiovascular Normal cardiovascular exam Rhythm:Regular Rate:Normal     Neuro/Psych    GI/Hepatic GI Bleed   Endo/Other  diabetes, Well Controlled, Type 2  Renal/GU      Musculoskeletal   Abdominal   Peds  Hematology  (+) anemia , Receiving 1 unit prbcs now and received 1 yesterday; Hgb 8.6 this am prior to transfusion; patient did have episode of orthostatic hypotension when transferring from chair to stretcher this am; received fluid bolus   Anesthesia Other Findings   Reproductive/Obstetrics                             Anesthesia Physical Anesthesia Plan  ASA: III and emergent  Anesthesia Plan: General   Post-op Pain Management:    Induction: Intravenous, Rapid sequence and Cricoid pressure planned  PONV Risk Score and Plan: Ondansetron  Airway Management Planned:   Additional Equipment:   Intra-op Plan:   Post-operative Plan: Extubation in OR  Informed Consent:   Dental advisory given  Plan Discussed with: Surgeon  Anesthesia Plan Comments:         Anesthesia Quick Evaluation

## 2016-09-12 NOTE — Anesthesia Procedure Notes (Signed)
Procedure Name: Intubation Date/Time: 09/12/2016 2:46 PM Performed by: Andree Elk, Timica Marcom A Pre-anesthesia Checklist: Patient identified, Emergency Drugs available, Suction available, Patient being monitored and Timeout performed Patient Re-evaluated:Patient Re-evaluated prior to induction Oxygen Delivery Method: Circle System Utilized Preoxygenation: Pre-oxygenation with 100% oxygen Induction Type: IV induction, Cricoid Pressure applied and Rapid sequence Laryngoscope Size: Miller and 3 Grade View: Grade I Tube type: Oral Tube size: 7.0 mm Number of attempts: 1 Airway Equipment and Method: Stylet Placement Confirmation: ETT inserted through vocal cords under direct vision,  positive ETCO2 and breath sounds checked- equal and bilateral Secured at: 21 cm Tube secured with: Tape Dental Injury: Teeth and Oropharynx as per pre-operative assessment

## 2016-09-12 NOTE — Progress Notes (Signed)
PROGRESS NOTE    Richard Simmons  XIP:[id number] DOB: 1945/07/04 DOA: 09/09/2016 PCP: Alain Marion, MD    Brief Narrative:  71 year old male presented with vertigo and weakness. Patient is known to have diabetes mellitus type 2, neuropathy, chronic back pain on narcotics. Apparently he was suffering from an exacerbation of his back pain, decided to lay down on the floor, appropriately he felt dizzy and lightheaded his wife noticed him to be pale and clammy, he was brought into the hospital for further evaluation. Denied any prior symptoms no fevers, no chills, no diarrhea. On physical examination blood pressure 107/59, 81/53, 91/54.heart rate 88-109, respiratory rate 16, oxygen saturation 100%, his mucous membranes were moist, his lungs were clear to auscultation bilaterally, heart S1-S2 present and rhythmic, his abdomen soft nontender, no lower extremity edema and no rashes. Sodium 140, potassium 4.7, chloride 107, bicarbonate 24, glucose 194, BUN 17, creatinine 1.03, AST 20, ALT 13, lactic acid 4.5, white count 21.9, hemoglobin 9.0, hematocrit 29.0, platelets 283, urinalysis negative for infection, urine drug screen positive for opiates, chest x-ray hypoinflated, no effusions, infiltrates or pneumothorax, EKG was sinus rhythm, left axis deviation, poor R-wave progression.  Patient was admitted to hospital with working diagnosis hypotension, systemic inflammatory response syndrome, rule out systemic infection/sepsis. Ruled out for sepsis, noted dark stools and worsening anemia, tested positive for occult blood in the stools. Gastroenterology consulted. 07/21 developed orthostatic syncope and frank hematemesis, complicated with hypotension, transferred to ICU. #2 PRBC transfused since admission.    Assessment & Plan:   Principal Problem:   Sepsis ([place]) Active Problems:   Vertigo   DM (diabetes mellitus) (HCC)   Chronic back pain   Arterial hypotension   Gastrointestinal hemorrhage with  melena   Anemia   1. Acute blood loss anemia, suspected upper GI bleed.  Patient with hematemesis and hypotension, will transfer to ICU, for close blood pressure monitoring, will order #2  PBC, case discussed with GI, patient will have endoscopy today. Change protonix to drip. NPO. IV analgesics and antiemetics.    2. NEW Hypovolemic shock due to acute upper GI bleed. Will continue volume resuscitation, with saline and blood transfusion     4. Acute on chronic back pain.  Continue codeine, cloanzepam and citalopram.   5. T2DM. Capillary glucose, 1121, [phone], continue insulin sliding scale, patient will be NPO due to gi bleeding.    Patient critically ill, hypovolemic shock due to acute upper GI bleed. Critical care time 60 minutes  DVT prophylaxis:SCD Code Status:full  Family Communication:I spoke with patient's family at the bedside and all questions were addressed.  Disposition Plan:home   Consultants:  GI  Procedures:    Antimicrobials:   Zosyn and Vancomycin (dc 07/19)   Subjective: Patient developed orthostatic syncope and hypotension associated with hematemesis. No chest pain or dyspnea, Very pale and deconditioned. Transfer to ICU.   Objective: Vitals:   09/12/16 0856 09/12/16 0900 09/12/16 0914 09/12/16 1147  BP: 121/63 (!) 68/19 (!) 55/47   Pulse: (!) 103 (!) 123 (!) 116 (!) 104  Resp:    18  Temp:    98.2 F (36.8 C)  TempSrc:    Axillary  SpO2:  99%  91%  Weight:    92.5 kg (203 lb 14.8 oz)  Height:    5\' 10"  (1.778 m)    Intake/Output Summary (Last 24 hours) at 09/12/16 1200 Last data filed at 09/12/16 0902  Gross per 24 hour  Intake  240 ml  Output                0 ml  Net              240 ml   Filed Weights   09/09/16 2306 09/10/16 0454 09/12/16 1147  Weight: 98 kg (216 lb) 97.8 kg (215 lb 9.8 oz) 92.5 kg (203 lb 14.8 oz)    Examination:  General exam: deconditioned and ill looking appearing E ENT, pale  with no icterus, oral mucosa moist. Respiratory system: No wheezing, rales or rhonchi.  Respiratory effort normal. Cardiovascular system: S1 & S2 heard, RRR. No JVD, murmurs, rubs, gallops or clicks. No pedal edema. Gastrointestinal system: Abdomen is mild distended, soft, but nontender. No organomegaly or masses felt. Normal bowel sounds heard. Central nervous system: Alert and oriented. No focal neurological deficits. Extremities: Symmetric 5 x 5 power. Skin: No rashes, lesions or ulcers     Data Reviewed: I have personally reviewed following labs and imaging studies  CBC:  Recent Labs Lab 09/09/16 2337 09/10/16 1008 09/11/16 0701 09/11/16 1011 09/11/16 1619 09/12/16 0024 09/12/16 0633  WBC 21.9* 13.5* 10.8*  --  10.6*  --  9.5  NEUTROABS 16.8* 10.6* 6.8  --  7.0  --  5.9  HGB 9.0* 7.4* 7.0* 7.0* 7.3* 8.2* 8.6*  HCT 29.0* 23.7* 22.2* 22.7* 23.3* 25.7* 26.9*  MCV 73.0* 73.1* 72.3*  --  72.4*  --  74.5*  PLT 283 214 205  --  215  --  409   Basic Metabolic Panel:  Recent Labs Lab 09/09/16 2337 09/10/16 1008 09/11/16 0701 09/12/16 0633  NA 140 135 136 138  K 4.7 4.7 4.2 3.9  CL 107 106 108 107  CO2 24 23 22 23   GLUCOSE 194* 156* 149* 134*  BUN 17 26* 26* 16  CREATININE 1.03 0.80 0.68 0.72  CALCIUM 8.1* 7.4* 7.8* 8.2*   GFR: Estimated Creatinine Clearance: 98.2 mL/min (by C-G formula based on SCr of 0.72 mg/dL). Liver Function Tests:  Recent Labs Lab 09/09/16 2337  AST 20  ALT 13*  ALKPHOS 69  BILITOT 0.4  PROT 5.4*  ALBUMIN 2.7*    Recent Labs Lab 09/09/16 2337  LIPASE 19   No results for input(s): AMMONIA in the last 168 hours. Coagulation Profile: No results for input(s): INR, PROTIME in the last 168 hours. Cardiac Enzymes:  Recent Labs Lab 09/09/16 2337  TROPONINI 0.04*   BNP (last 3 results) No results for input(s): PROBNP in the last 8760 hours. HbA1C: No results for input(s): HGBA1C in the last 72 hours. CBG:  Recent Labs Lab  09/11/16 0745 09/11/16 1212 09/11/16 2217 09/12/16 0801 09/12/16 0850  GLUCAP 145* 142* 121* 145* 214*   Lipid Profile: No results for input(s): CHOL, HDL, LDLCALC, TRIG, CHOLHDL, LDLDIRECT in the last 72 hours. Thyroid Function Tests:  Recent Labs  09/10/16 0024  TSH 2.011   Anemia Panel:  Recent Labs  09/11/16 1011  FERRITIN 10*   Sepsis Labs:  Recent Labs Lab 09/09/16 2356 09/10/16 0301 09/10/16 1618 09/10/16 1736  LATICACIDVEN 4.56* 4.76* 1.9 2.3*    Recent Results (from the past 240 hour(s))  Blood Culture (routine x 2)     Status: None (Preliminary result)   Collection Time: 09/10/16 12:16 AM  Result Value Ref Range Status   Specimen Description BLOOD LEFT ARM  Final   Special Requests   Final    BOTTLES DRAWN AEROBIC AND ANAEROBIC Blood Culture adequate volume  Culture NO GROWTH 2 DAYS  Final   Report Status PENDING  Incomplete  Blood Culture (routine x 2)     Status: None (Preliminary result)   Collection Time: 09/10/16 12:24 AM  Result Value Ref Range Status   Specimen Description BLOOD LEFT ARM  Final   Special Requests   Final    BOTTLES DRAWN AEROBIC AND ANAEROBIC Blood Culture adequate volume   Culture NO GROWTH 2 DAYS  Final   Report Status PENDING  Incomplete  Urine culture     Status: None   Collection Time: 09/10/16  3:00 AM  Result Value Ref Range Status   Specimen Description URINE, RANDOM  Final   Special Requests NONE  Final   Culture   Final    NO GROWTH Performed at [facility], [place] [address] [place], [place] [address]    Report Status 09/11/2016 FINAL  Final  MRSA PCR Screening     Status: None   Collection Time: 09/10/16  4:49 AM  Result Value Ref Range Status   MRSA by PCR NEGATIVE NEGATIVE Final    Comment:        The GeneXpert MRSA Assay (FDA approved for NASAL specimens only), is one component of a comprehensive MRSA colonization surveillance program. It is not intended to diagnose MRSA infection nor to  guide or monitor treatment for MRSA infections.          Radiology Studies: No results found.      Scheduled Meds: . citalopram  40 mg Oral Daily  . clonazePAM  0.5 mg Oral QHS  . enoxaparin (LOVENOX) injection  40 mg Subcutaneous Q24H  . gabapentin  400 mg Oral TID  . insulin aspart  0-15 Units Subcutaneous TID WC  . insulin aspart  0-5 Units Subcutaneous QHS  . pantoprazole (PROTONIX) IV  40 mg Intravenous Q12H  . sodium chloride flush  3 mL Intravenous Q12H   Continuous Infusions: . sodium chloride    . sodium chloride       LOS: 2 days      Tawni Millers, MD [facility] Pager [phone]  If 7PM-7AM, please contact night-coverage [email or web address] Password [email or web address] 09/12/2016, 12:00 PM

## 2016-09-12 NOTE — Anesthesia Postprocedure Evaluation (Signed)
Anesthesia Post Note  Patient: Richard Simmons  Procedure(s) Performed: Procedure(s) (LRB): ESOPHAGOGASTRODUODENOSCOPY (EGD) WITH PROPOFOL (N/A)  Patient location during evaluation: ICU Anesthesia Type: General Level of consciousness: awake and confused Pain management: pain level controlled Vital Signs Assessment: post-procedure vital signs reviewed and stable Respiratory status: spontaneous breathing Cardiovascular status: stable Postop Assessment: no signs of nausea or vomiting Anesthetic complications: no     Last Vitals:  Vitals:   09/12/16 1435 09/12/16 1558  BP:  (!) 115/57  Pulse: 94 (!) 106  Resp:  17  Temp:  36.7 C    Last Pain:  Vitals:   09/12/16 1558  TempSrc:   PainSc: 0-No pain                 Dimarco Minkin A

## 2016-09-12 NOTE — Progress Notes (Signed)
Endo RN here to get pt for EGD. Pt walking to stretcher from with standby assist.  Pt stated "I feel dizzy"  RN assisted pt to sit in floor as they could not make back to chair or bed.  Pt was still breathing, not able to answer questions at first.  Assisted pt back to chair, at this time able to answer orientation questions.  Vital Signs as below. Will continue to monitor.  Dr Cathlean Sauer aware  500cc Bolus ordered and administered.  0845  BP 68/19 (Sitting) HR 120 0850  BP 114/63 (Lying) HR 115  CBG 214 0856  BP 121/63 (lying)  HR 103

## 2016-09-12 NOTE — Progress Notes (Signed)
**Note De-Identified Richard Simmons Obfuscation** RRT responded to rapid response due to patient  lightheadedness while ambulating. Patient recovered once sitting.  SAT 99% on ra, HR 125.  Placed on 2 L [facility] for SOB.  RRT to continue to monitor.

## 2016-09-13 DIAGNOSIS — E118 Type 2 diabetes mellitus with unspecified complications: Secondary | ICD-10-CM

## 2016-09-13 DIAGNOSIS — K250 Acute gastric ulcer with hemorrhage: Secondary | ICD-10-CM

## 2016-09-13 LAB — GLUCOSE, CAPILLARY
GLUCOSE-CAPILLARY: 154 mg/dL — AB (ref 65–99)
GLUCOSE-CAPILLARY: 136 mg/dL — AB (ref 65–99)
Glucose-Capillary: 147 mg/dL — ABNORMAL HIGH (ref 65–99)
Glucose-Capillary: 123 mg/dL — ABNORMAL HIGH (ref 65–99)

## 2016-09-13 LAB — BASIC METABOLIC PANEL
ANION GAP: 5 (ref 5–15)
BUN: 32 mg/dL — AB (ref 6–20)
CALCIUM: 7.5 mg/dL — AB (ref 8.9–10.3)
CO2: 24 mmol/L (ref 22–32)
CREATININE: 0.75 mg/dL (ref 0.61–1.24)
Chloride: 111 mmol/L (ref 101–111)
GFR calc Af Amer: >60 mL/min (ref >60)
GLUCOSE: 169 mg/dL — AB (ref 65–99)
Potassium: 4.2 mmol/L (ref 3.5–5.1)
Sodium: 140 mmol/L (ref 135–145)

## 2016-09-13 LAB — CBC WITH DIFFERENTIAL/PLATELET
BASOS ABS: 0 10*3/uL (ref 0.0–0.1)
Basophils Relative: 0 %
EOS PCT: 1 %
Eosinophils Absolute: 0.1 10*3/uL (ref 0.0–0.7)
HEMATOCRIT: 24.1 % — AB (ref 39.0–52.0)
Hemoglobin: 7.9 g/dL — ABNORMAL LOW (ref 13.0–17.0)
LYMPHS PCT: 17 %
Lymphs Abs: 2.5 10*3/uL (ref 0.7–4.0)
MCH: 25.8 pg — AB (ref 26.0–34.0)
MCHC: 32.8 g/dL (ref 30.0–36.0)
MCV: 78.8 fL (ref 78.0–100.0)
MONO ABS: 1.2 10*3/uL — AB (ref 0.1–1.0)
MONOS PCT: 8 %
Neutro Abs: 11.1 10*3/uL — ABNORMAL HIGH (ref 1.7–7.7)
Neutrophils Relative %: 74 %
PLATELETS: 183 10*3/uL (ref 150–400)
RBC: 3.06 MIL/uL — ABNORMAL LOW (ref 4.22–5.81)
RDW: 17.9 % — AB (ref 11.5–15.5)
WBC: 14.9 10*3/uL — ABNORMAL HIGH (ref 4.0–10.5)

## 2016-09-13 LAB — PREPARE RBC (CROSSMATCH)

## 2016-09-13 LAB — HEMOGLOBIN AND HEMATOCRIT, BLOOD
HCT: 23.1 % — ABNORMAL LOW (ref 39.0–52.0)
HEMATOCRIT: 28.3 % — AB (ref 39.0–52.0)
Hemoglobin: 7.6 g/dL — ABNORMAL LOW (ref 13.0–17.0)
Hemoglobin: 9.5 g/dL — ABNORMAL LOW (ref 13.0–17.0)

## 2016-09-13 MED ORDER — SODIUM CHLORIDE 0.9 % IV SOLN
Freq: Once | INTRAVENOUS | Status: AC
  Administered 2016-09-13: 1 mL via INTRAVENOUS

## 2016-09-13 MED ORDER — ACETAMINOPHEN 325 MG PO TABS
650.0000 mg | ORAL_TABLET | Freq: Once | ORAL | Status: AC
  Administered 2016-09-13: 650 mg via ORAL
  Filled 2016-09-13: qty 2

## 2016-09-13 MED ORDER — DIPHENHYDRAMINE HCL 25 MG PO CAPS
25.0000 mg | ORAL_CAPSULE | Freq: Once | ORAL | Status: AC
  Administered 2016-09-13: 25 mg via ORAL
  Filled 2016-09-13: qty 1

## 2016-09-13 MED ORDER — FUROSEMIDE 10 MG/ML IJ SOLN
20.0000 mg | Freq: Once | INTRAMUSCULAR | Status: AC
  Administered 2016-09-13: 20 mg via INTRAVENOUS
  Filled 2016-09-13: qty 2

## 2016-09-13 NOTE — Progress Notes (Signed)
  Subjective:  Patient has no complaints. He denies nausea vomiting heartburn chest or abdominal pain. He had multiple tarry stools last night and small one this morning. He denies shortness of breath.  Objective: Blood pressure (!) 123/59, pulse 94, temperature 100.2 F (37.9 C), temperature source Oral, resp. rate (!) 22, height 5\' 10"  (1.778 m), weight 208 lb 1.8 oz (94.4 kg), SpO2 98 %. She is alert and in no acute distress. Conjunctiva is pale.  Oropharyngeal mucosa is normal. Cardiac exam with regular rhythm normal S1 and S2. No murmur or gallop noted. Lungs are clear to auscultation. Abdomen is full. Bowel sounds are normal. On palpation abdomen is soft and nontender without organomegaly or masses. No LE edema or clubbing noted.  Labs/studies Results:   Recent Labs  09/12/16 0633 09/12/16 2156 09/13/16 0517  WBC 9.5 15.7* 14.9*  HGB 8.6* 8.4* 7.9*  HCT 26.9* 24.7* 24.1*  PLT 222 181 183    BMET   Recent Labs  09/11/16 0701 09/12/16 0633 09/13/16 0517  NA 136 138 140  K 4.2 3.9 4.2  CL 108 107 111  CO2 22 23 24   GLUCOSE 149* 134* 169*  BUN 26* 16 32*  CREATININE 0.68 0.72 0.75  CALCIUM 7.8* 8.2* 7.5*      Recent Labs  09/12/16 2156  LABPROT 16.2*  INR 1.29      Assessment:  #1. Upper GI bleed secondary to gastric ulcer. Status post therapeutic EGD yesterday. Patient has received 3 units of PRBCs. He is hemodynamically stable. BUN mildly elevated secondary to GI bleed but serum creatinine normal.  #2. Anemia secondary to upper GI bleed. He may need another unit of PRBCs if hemoglobin drops any further.   Recommendations:  Continue pantoprazole infusion. H&H this afternoon. H. pylori serology. Begin clear liquid diet.

## 2016-09-13 NOTE — Progress Notes (Signed)
PROGRESS NOTE    Richard Simmons  OJJ:[id number] DOB: 1945/09/09 DOA: 09/09/2016 PCP: Alain Marion, MD    Brief Narrative: 71 year old male presented with vertigo and weakness. Patient is known to have diabetes mellitus type 2, neuropathy, chronic back pain on narcotics. Apparently he was suffering from an exacerbation of his back pain, decided to lay down on the floor, appropriately he felt dizzy and lightheaded his wife noticed him to be pale and clammy, he was brought into the hospital for further evaluation. Denied any prior symptoms no fevers, no chills, no diarrhea. On physical examination blood pressure 107/59, 81/53, 91/54.heart rate 88-109, respiratory rate 16, oxygen saturation 100%, his mucous membranes were moist, his lungs were clear to auscultation bilaterally, heart S1-S2 present and rhythmic, his abdomen soft nontender, no lower extremity edema and no rashes. Sodium 140, potassium 4.7, chloride 107, bicarbonate 24, glucose 194, BUN 17, creatinine 1.03, AST 20, ALT 13, lactic acid 4.5, white count 21.9, hemoglobin 9.0, hematocrit 29.0, platelets 283, urinalysis negative for infection, urine drug screen positive for opiates, chest x-ray hypoinflated, no effusions, infiltrates or pneumothorax, EKG was sinus rhythm, left axis deviation, poor R-wave progression.  Patient was admitted to hospital with working diagnosis hypotension, systemic inflammatory response syndrome, rule out systemic infection/sepsis. Ruled out for sepsis, noted dark stools and worsening anemia, tested positive for occult blood in the stools. Gastroenterology consulted. 07/21 developed orthostatic syncope and frank hematemesis, complicated with hypotension, transferred to ICU. #3 PRBC transfused since admission.  EGD positive for non-bleeding gastric ulcer and multiple non bleeding duodenal ulcers, fresh blood noted in the gastric fundus.    Assessment & Plan:   Principal Problem:   Sepsis ([place]) Active  Problems:   Vertigo   DM (diabetes mellitus) (HCC)   Chronic back pain   Arterial hypotension   Gastrointestinal hemorrhage with melena   Anemia   1. Acute blood loss anemia, suspected upper GI bleed.  hemoglobin at 7.9 and hct at 24 after 3 units prbc, will continue to monitor cell count, no signs of active bleeding, will advance diet per GI recommendations. Continue IV pantoprazole drip, telemetry monitoring, keep MAP above 65. Will hold on IV fluids and will rebolus as needed, follow a restrictive IV fluids strategy.   2. Hypovolemic shock due to acute upper GI bleed. Patient has regained hemodynamic stability, will hold on IV fluids and will continue to monitor blood pressure, target MAP above 65. Systolic blood pressure 937 to 120    4. Acute on chronic back pain. Resume oral pain regimen with codeine, cloanzepam and citalopram.   5. T2DM. Capillary glucose, 192, 134, 154, 147. Diet has been resumed, will continue insulin sliding scale, for glucose cover and monitoring.     DVT prophylaxis:SCD Code Status:full  Family Communication:I spoke with patient's family at the bedside and all questions were addressed.  Disposition Plan:home   Consultants:  GI  Procedures:    Antimicrobials:   Zosyn and Vancomycin (dc 07/19)   Subjective: Patient underwent urgent endoscopy, transferred to the ICU, transfused prbc. Had dark stool overnight, no abdominal pain, no nausea or vomiting. No chest pain, or dyspnea.   Objective: Vitals:   09/13/16 0400 09/13/16 0500 09/13/16 0600 09/13/16 0714  BP:  126/61 (!) 123/59   Pulse:  94 91 99  Resp: (!) 8 (!) 22 (!) 27 (!) 24  Temp: 99.2 F (37.3 C)   100 F (37.8 C)  TempSrc: Oral   Oral  SpO2: 100% 100% 98% 99%  Weight:  94.4 kg (208 lb 1.8 oz)    Height:        Intake/Output Summary (Last 24 hours) at 09/13/16 0803 Last data filed at 09/13/16 0714  Gross per 24 hour  Intake             2475 ml  Output               600 ml  Net             1875 ml   Filed Weights   09/10/16 0454 09/12/16 1147 09/13/16 0500  Weight: 97.8 kg (215 lb 9.8 oz) 92.5 kg (203 lb 14.8 oz) 94.4 kg (208 lb 1.8 oz)    Examination:  General exam: deconditioned E ENT: pallor, no icterus, oral mucosa dry Respiratory system:  No wheezing, rales or rhonchi. Respiratory effort normal. Cardiovascular system: S1 & S2 heard, RRR. No JVD, murmurs, rubs, gallops or clicks. No pedal edema. Gastrointestinal system: Abdomen is mild distended, soft and nontender. No organomegaly or masses felt. Normal bowel sounds heard. Central nervous system: Alert and oriented. No focal neurological deficits. Extremities: Symmetric 5 x 5 power. Skin: No rashes, lesions or ulcers     Data Reviewed: I have personally reviewed following labs and imaging studies  CBC:  Recent Labs Lab 09/10/16 1008 09/11/16 0701  09/11/16 1619 09/12/16 0024 09/12/16 2025 09/12/16 2156 09/13/16 0517  WBC 13.5* 10.8*  --  10.6*  --  9.5 15.7* 14.9*  NEUTROABS 10.6* 6.8  --  7.0  --  5.9  --  11.1*  HGB 7.4* 7.0*  < > 7.3* 8.2* 8.6* 8.4* 7.9*  HCT 23.7* 22.2*  < > 23.3* 25.7* 26.9* 24.7* 24.1*  MCV 73.1* 72.3*  --  72.4*  --  74.5* 77.2* 78.8  PLT 214 205  --  215  --  222 181 183  < > = values in this interval not displayed. Basic Metabolic Panel:  Recent Labs Lab 09/09/16 2337 09/10/16 1008 09/11/16 0701 09/12/16 0633 09/13/16 0517  NA 140 135 136 138 140  K 4.7 4.7 4.2 3.9 4.2  CL 107 106 108 107 111  CO2 24 23 22 23 24   GLUCOSE 194* 156* 149* 134* 169*  BUN 17 26* 26* 16 32*  CREATININE 1.03 0.80 0.68 0.72 0.75  CALCIUM 8.1* 7.4* 7.8* 8.2* 7.5*   GFR: Estimated Creatinine Clearance: 99.2 mL/min (by C-G formula based on SCr of 0.75 mg/dL). Liver Function Tests:  Recent Labs Lab 09/09/16 2337  AST 20  ALT 13*  ALKPHOS 69  BILITOT 0.4  PROT 5.4*  ALBUMIN 2.7*    Recent Labs Lab 09/09/16 2337  LIPASE 19   No results for  input(s): AMMONIA in the last 168 hours. Coagulation Profile:  Recent Labs Lab 09/12/16 2156  INR 1.29   Cardiac Enzymes:  Recent Labs Lab 09/09/16 2337  TROPONINI 0.04*   BNP (last 3 results) No results for input(s): PROBNP in the last 8760 hours. HbA1C: No results for input(s): HGBA1C in the last 72 hours. CBG:  Recent Labs Lab 09/12/16 0850 09/12/16 1434 09/12/16 1635 09/12/16 2204 09/13/16 0712  GLUCAP 214* 140* 192* 134* 154*   Lipid Profile: No results for input(s): CHOL, HDL, LDLCALC, TRIG, CHOLHDL, LDLDIRECT in the last 72 hours. Thyroid Function Tests: No results for input(s): TSH, T4TOTAL, FREET4, T3FREE, THYROIDAB in the last 72 hours. Anemia Panel:  Recent Labs  09/11/16 1011 09/12/16 0633  FERRITIN 10*  --   TIBC  --  402  IRON  --  33*   Sepsis Labs:  Recent Labs Lab 09/09/16 2356 09/10/16 0301 09/10/16 1618 09/10/16 1736  LATICACIDVEN 4.56* 4.76* 1.9 2.3*    Recent Results (from the past 240 hour(s))  Blood Culture (routine x 2)     Status: None (Preliminary result)   Collection Time: 09/10/16 12:16 AM  Result Value Ref Range Status   Specimen Description BLOOD LEFT ARM  Final   Special Requests   Final    BOTTLES DRAWN AEROBIC AND ANAEROBIC Blood Culture adequate volume   Culture NO GROWTH 2 DAYS  Final   Report Status PENDING  Incomplete  Blood Culture (routine x 2)     Status: None (Preliminary result)   Collection Time: 09/10/16 12:24 AM  Result Value Ref Range Status   Specimen Description BLOOD LEFT ARM  Final   Special Requests   Final    BOTTLES DRAWN AEROBIC AND ANAEROBIC Blood Culture adequate volume   Culture NO GROWTH 2 DAYS  Final   Report Status PENDING  Incomplete  Urine culture     Status: None   Collection Time: 09/10/16  3:00 AM  Result Value Ref Range Status   Specimen Description URINE, RANDOM  Final   Special Requests NONE  Final   Culture   Final    NO GROWTH Performed at [facility], [place] [address] [place], [place] [address]    Report Status 09/11/2016 FINAL  Final  MRSA PCR Screening     Status: None   Collection Time: 09/10/16  4:49 AM  Result Value Ref Range Status   MRSA by PCR NEGATIVE NEGATIVE Final    Comment:        The GeneXpert MRSA Assay (FDA approved for NASAL specimens only), is one component of a comprehensive MRSA colonization surveillance program. It is not intended to diagnose MRSA infection nor to guide or monitor treatment for MRSA infections.          Radiology Studies: No results found.      Scheduled Meds: . citalopram  40 mg Oral Daily  . clonazePAM  0.5 mg Oral QHS  . gabapentin  400 mg Oral TID  . insulin aspart  0-15 Units Subcutaneous TID WC  . insulin aspart  0-5 Units Subcutaneous QHS  . metoCLOPramide (REGLAN) injection  10 mg Intravenous Q6H  . [START ON 09/16/2016] pantoprazole  40 mg Intravenous Q12H  . sodium chloride flush  3 mL Intravenous Q12H   Continuous Infusions: . sodium chloride    . sodium chloride    . lactated ringers 75 mL (09/12/16 2258)  . pantoprozole (PROTONIX) infusion 8 mg/hr (09/13/16 0600)     LOS: 3 days        Germaine Ripp Gerome Apley, MD [facility] Pager [phone]  If 7PM-7AM, please contact night-coverage [email or web address] Password [facility] 09/13/2016, 8:03 AM

## 2016-09-13 NOTE — Addendum Note (Signed)
Addendum  created 09/13/16 1212 by Mickel Baas, CRNA   Sign clinical note

## 2016-09-13 NOTE — Anesthesia Postprocedure Evaluation (Signed)
Anesthesia Post Note  Patient: Dona Klemann  Procedure(s) Performed: Procedure(s) (LRB): ESOPHAGOGASTRODUODENOSCOPY (EGD) WITH PROPOFOL (N/A)  Patient location during evaluation: A-ICU Anesthesia Type: General Level of consciousness: patient cooperative and awake and alert Pain management: pain level controlled Vital Signs Assessment: post-procedure vital signs reviewed and stable Respiratory status: spontaneous breathing and patient connected to nasal cannula oxygen Cardiovascular status: stable Anesthetic complications: no Comments: Chart review only     Last Vitals:  Vitals:   09/13/16 0714 09/13/16 1124  BP:    Pulse: 99 94  Resp: (!) 24 (!) 22  Temp: 37.8 C 37.9 C    Last Pain:  Vitals:   09/13/16 1124  TempSrc: Oral  PainSc:                  ADAMS, AMY A

## 2016-09-13 NOTE — Progress Notes (Signed)
Hemoglobin is 7.6 g Will transfuse 1 unit of PRBCs.

## 2016-09-14 ENCOUNTER — Encounter: Payer: Self-pay | Admitting: Nurse Practitioner

## 2016-09-14 ENCOUNTER — Encounter (HOSPITAL_COMMUNITY): Payer: Self-pay | Admitting: Internal Medicine

## 2016-09-14 LAB — BASIC METABOLIC PANEL
Anion gap: 6 (ref 5–15)
BUN: 18 mg/dL (ref 6–20)
CALCIUM: 7.6 mg/dL — AB (ref 8.9–10.3)
CO2: 28 mmol/L (ref 22–32)
CREATININE: 0.7 mg/dL (ref 0.61–1.24)
Chloride: 106 mmol/L (ref 101–111)
GFR calc Af Amer: >60 mL/min (ref >60)
GFR calc non Af Amer: >60 mL/min (ref >60)
GLUCOSE: 128 mg/dL — AB (ref 65–99)
Potassium: 3.5 mmol/L (ref 3.5–5.1)
Sodium: 140 mmol/L (ref 135–145)

## 2016-09-14 LAB — CBC WITH DIFFERENTIAL/PLATELET
Basophils Absolute: 0 10*3/uL (ref 0.0–0.1)
Basophils Relative: 0 %
Eosinophils Absolute: 0.2 10*3/uL (ref 0.0–0.7)
Eosinophils Relative: 2 %
HEMATOCRIT: 26.2 % — AB (ref 39.0–52.0)
Hemoglobin: 8.8 g/dL — ABNORMAL LOW (ref 13.0–17.0)
LYMPHS PCT: 19 %
Lymphs Abs: 2.4 10*3/uL (ref 0.7–4.0)
MCH: 27.6 pg (ref 26.0–34.0)
MCHC: 33.6 g/dL (ref 30.0–36.0)
MCV: 82.1 fL (ref 78.0–100.0)
MONO ABS: 1 10*3/uL (ref 0.1–1.0)
MONOS PCT: 8 %
NEUTROS ABS: 9.1 10*3/uL — AB (ref 1.7–7.7)
Neutrophils Relative %: 71 %
Platelets: 165 10*3/uL (ref 150–400)
RBC: 3.19 MIL/uL — ABNORMAL LOW (ref 4.22–5.81)
RDW: 19.2 % — AB (ref 11.5–15.5)
WBC: 12.7 10*3/uL — ABNORMAL HIGH (ref 4.0–10.5)

## 2016-09-14 LAB — GLUCOSE, CAPILLARY
GLUCOSE-CAPILLARY: 136 mg/dL — AB (ref 65–99)
GLUCOSE-CAPILLARY: 119 mg/dL — AB (ref 65–99)
GLUCOSE-CAPILLARY: 140 mg/dL — AB (ref 65–99)
Glucose-Capillary: 156 mg/dL — ABNORMAL HIGH (ref 65–99)
Glucose-Capillary: 144 mg/dL — ABNORMAL HIGH (ref 65–99)

## 2016-09-14 MED ORDER — CHLORHEXIDINE GLUCONATE CLOTH 2 % EX PADS
6.0000 | MEDICATED_PAD | Freq: Every day | CUTANEOUS | Status: DC
  Administered 2016-09-14 – 2016-09-15 (×2): 6 via TOPICAL

## 2016-09-14 MED ORDER — SODIUM CHLORIDE 0.9% FLUSH
10.0000 mL | Freq: Two times a day (BID) | INTRAVENOUS | Status: DC
  Administered 2016-09-14 – 2016-09-15 (×3): 10 mL

## 2016-09-14 MED ORDER — SODIUM CHLORIDE 0.9% FLUSH: 10.0000 mL | INTRAVENOUS | Status: DC | PRN

## 2016-09-14 MED ORDER — PANTOPRAZOLE SODIUM 40 MG PO TBEC: 40.0000 mg | DELAYED_RELEASE_TABLET | Freq: Two times a day (BID) | ORAL | Status: DC

## 2016-09-14 NOTE — Progress Notes (Signed)
Subjective: Feels significantly better. No bowel movement in the past day. Has not had any further N/V. Denies abdominal pain, chest pain, dizziness, lightheadedness. No other GI complaints.  Objective: Vital signs in last 24 hours: Temp:  [97.5 F (36.4 C)-100.5 F (38.1 C)] 100.1 F (37.8 C) (07/23 1159) Pulse Rate:  [83-99] 96 (07/23 1100) Resp:  [16-27] 22 (07/23 1100) BP: (107-142)/(53-75) 134/61 (07/23 1100) SpO2:  [91 %-100 %] 91 % (07/23 1100) Weight:  [202 lb 9.6 oz (91.9 kg)] 202 lb 9.6 oz (91.9 kg) (07/23 0500) Last BM Date: 09/13/16 General:   Alert and oriented, pleasant Head:  Normocephalic and atraumatic. Eyes:  No icterus, sclera clear. Conjuctiva pink.  Heart:  S1, S2 present, no murmurs noted.  Lungs: Clear to auscultation bilaterally, without wheezing, rales, or rhonchi.  Abdomen:  Bowel sounds present, soft, non-distended. Mild epigastric TTP.No rebound or guarding. Msk:  Symmetrical without gross deformities. Pulses:  Normal bilateral pedal pulses noted. Extremities:  Without clubbing or edema. Neurologic:  Alert and  oriented x4; grossly normal neurologically. Psych:  Alert and cooperative. Normal mood and affect.  Intake/Output from previous day: 07/22 0701 - 07/23 0700 In: 998 [I.V.:353; Blood:645] Out: 1350 [Urine:1350] Intake/Output this shift: Total I/O In: 20 [I.V.:20] Out: -   Lab Results:  Recent Labs  09/12/16 2156 09/13/16 0517 09/13/16 1346 09/13/16 1909 09/14/16 0417  WBC 15.7* 14.9*  --   --  12.7*  HGB 8.4* 7.9* 7.6* 9.5* 8.8*  HCT 24.7* 24.1* 23.1* 28.3* 26.2*  PLT 181 183  --   --  165   BMET  Recent Labs  09/12/16 0633 09/13/16 0517 09/14/16 0417  NA 138 140 140  K 3.9 4.2 3.5  CL 107 111 106  CO2 23 24 28   GLUCOSE 134* 169* 128*  BUN 16 32* 18  CREATININE 0.72 0.75 0.70  CALCIUM 8.2* 7.5* 7.6*   LFT No results for input(s): PROT, ALBUMIN, AST, ALT, ALKPHOS, BILITOT, BILIDIR, IBILI in the last 72  hours. PT/INR  Recent Labs  09/12/16 2156  LABPROT 16.2*  INR 1.29   Hepatitis Panel No results for input(s): HEPBSAG, HCVAB, HEPAIGM, HEPBIGM in the last 72 hours.   Studies/Results: No results found.  Assessment: Pleasant 71 year old gentleman who is admitted for systemic inflammatory response syndrome, lactic acidosis, hypotension, and presumed infection. Antibiotics initially started but then discontinued due to no source of infection and afebrile. We were consulted for anemia. On admission his hemoglobin was 9.0 and has subsequently dropped to 7.0 this morning which was confirmed on recheck 3 hours later. His wife noted black stools which were loose last night. The patient notes possibly some are slightly black stools previous to admission. Denies NSAIDs but does endorse daily aspirin powder use, last use occurred night before his admission. Denies abdominal pain. Not currently on daily aspirin. He is not on an acid blocker at home. Deemed likely UGI bleed, no indication for urgent colonoscopy at this time. I discussed with him the need to undergo colonoscopy for routine screening and he states he can have this done at the [place] and we'll pursue it as an outpatient.  Planned EGD the morning of Saturday 09/12/16 but at that time the patient became hypotensive and vomiting blood. He was stabalized and transferred to ICU. He underwent EGD that afternoon which showed Normal esophagus, irregular Z line. Significant fresh blood and clotted blood in the gastric fundus and gastric body some of which was removed during EGD. Nonbleeding gastric  ulcer with adherent clot which was injected and treated with a heater probe. Multiple nonbleeding duodenal ulcers with no stigmata of bleeding. Normal second duodenum. He was returned to the ICU, made nothing by mouth for the remainder of the day and placed on a PPI drip. Recommended repeat EGD in 10-12 weeks. H. pylori serologies pending.  His H. Pylori pathology  still pending today. His hgb is somewhat down today (8.8) as is WBC and platelets, query dilutional effects with fluid resuscitation.BUN has normalized today.  Vitals are stable today. Will need to follow H/H closely. Overall he is clinically much improved. He states he was told he will likely go upstairs to the floor today. Is tolerating regular diet well.    Plan: 1. Continue PPI gtt for total 72 hours 2. Will need to be on bid PPI x minimum 3 months 3. Follow H. Pylori serologies 4. Repeat EGD as outpatient 12 weeks 5. Refrain from all NSAIDs and ASA powders 6. Monitor for recurrent GI bleed 7. Monitor/trend for changes in hgb 8. Transfuse as necessary 9. Supportive measures   Thank you for allowing [place] to participate in the care of Richard Riding, DNP, AGNP-C Adult & Gerontological Nurse Practitioner [facility]     LOS: 4 days    09/14/2016, 12:03 PM

## 2016-09-14 NOTE — Progress Notes (Signed)
PROGRESS NOTE    Richard Simmons  ERX:[id number] DOB: 1945/06/04 DOA: 09/09/2016 PCP: Alain Marion, MD    Brief Narrative:  71 year old male presented with vertigo and weakness. Patient is known to have diabetes mellitus type 2, neuropathy, chronic back pain on narcotics. Apparently he was suffering from an exacerbation of his back pain, decided to lay down on the floor, appropriately he felt dizzy and lightheaded his wife noticed him to be pale and clammy, he was brought into the hospital for further evaluation. Denied any prior symptoms no fevers, no chills, no diarrhea. On physical examination blood pressure 107/59, 81/53, 91/54.heart rate 88-109, respiratory rate 16, oxygen saturation 100%, his mucous membranes were moist, his lungs were clear to auscultation bilaterally, heart S1-S2 present and rhythmic, his abdomen soft nontender, no lower extremity edema and no rashes. Sodium 140, potassium 4.7, chloride 107, bicarbonate 24, glucose 194, BUN 17, creatinine 1.03, AST 20, ALT 13, lactic acid 4.5, white count 21.9, hemoglobin 9.0, hematocrit 29.0, platelets 283, urinalysis negative for infection, urine drug screen positive for opiates, chest x-ray hypoinflated, no effusions, infiltrates or pneumothorax, EKG was sinus rhythm, left axis deviation, poor R-wave progression.  Patient was admitted to hospital with working diagnosis hypotension, systemic inflammatory response syndrome, rule out systemic infection/sepsis. Ruled out for sepsis, noted dark stools and worsening anemia, tested positive for occult blood in the stools. Gastroenterology consulted. 07/21 developed orthostatic syncope and frank hematemesis, complicated with hypotension, transferred to ICU. #4 PRBC transfused since admission.  EGD positive for non-bleeding gastric ulcer and multiple non bleeding duodenal ulcers, fresh blood noted in the gastric fundus.    Assessment & Plan:   Principal Problem:   Sepsis ([place]) Active  Problems:   Vertigo   DM (diabetes mellitus) (HCC)   Chronic back pain   Arterial hypotension   Gastrointestinal hemorrhage with melena   Anemia    1. Acute blood loss anemia, suspected upper GI bleed. hemoglobin 8.8 and hct at 26 after 4 units prbc. Patient with stable blood pressure, no signs of active bleeding, will continue to follow H&H.  2. Hypovolemic shock due to acute upper GI bleed.Blood pressure 950 to 932 systolic, will continue to follow on telemetry, out of bed as tolerated and physical therapy evaluation. Holding antihypertensive agents.  Will transfer to medical ward with remote telemetry.   4. Acute on chronic back pain. Tolerating well codeine, cloanzepam and citalopram.   5. T2DM.Capillary glucose,136, 156, 144. Insulin sliding scale, for glucose cover and monitoring.Patient tolerating po well.     DVT prophylaxis:SCD Code Status:full  Family Communication:I spoke with patient's family at the bedside and all questions were addressed.  Disposition Plan:home   Consultants:  GI  Procedures:    Antimicrobials:   Zosyn and Vancomycin (dc 07/19)    Subjective: Feeling better, no nausea or vomiting, no further bleeding, no chest pain or dyspnea. Tolerating po well.   Objective: Vitals:   09/14/16 0200 09/14/16 0300 09/14/16 0400 09/14/16 0500  BP: (!) 112/53 (!) 120/57 (!) 115/58 123/61  Pulse: 83 89 87 87  Resp: (!) 21 (!) 27 19 18   Temp:    (!) 97.5 F (36.4 C)  TempSrc:    Axillary  SpO2: 97% 97% 97% 97%  Weight:    91.9 kg (202 lb 9.6 oz)  Height:        Intake/Output Summary (Last 24 hours) at 09/14/16 0749 Last data filed at 09/14/16 0600  Gross per 24 hour  Intake  998 ml  Output             1050 ml  Net              -52 ml   Filed Weights   09/12/16 1147 09/13/16 0500 09/14/16 0500  Weight: 92.5 kg (203 lb 14.8 oz) 94.4 kg (208 lb 1.8 oz) 91.9 kg (202 lb 9.6 oz)    Examination:  General exam:  not in pain or dyspnea. E ENT: mild pallor, no icterus, oral mucosa moist.  Respiratory system: Clear to auscultation. Respiratory effort normal. No wheezing, rales or rhonchi.  Cardiovascular system: S1 & S2 heard, RRR. No JVD, murmurs, rubs, gallops or clicks. No pedal edema. Gastrointestinal system: Abdomen is nondistended, soft and nontender. No organomegaly or masses felt. Normal bowel sounds heard. Central nervous system: Alert and oriented. No focal neurological deficits. Extremities: Symmetric 5 x 5 power. Skin: No rashes, lesions or ulcers      Data Reviewed: I have personally reviewed following labs and imaging studies  CBC:  Recent Labs Lab 09/11/16 0701  09/11/16 1619  09/12/16 0633 09/12/16 2156 09/13/16 0517 09/13/16 1346 09/13/16 1909 09/14/16 0417  WBC 10.8*  --  10.6*  --  9.5 15.7* 14.9*  --   --  12.7*  NEUTROABS 6.8  --  7.0  --  5.9  --  11.1*  --   --  9.1*  HGB 7.0*  < > 7.3*  < > 8.6* 8.4* 7.9* 7.6* 9.5* 8.8*  HCT 22.2*  < > 23.3*  < > 26.9* 24.7* 24.1* 23.1* 28.3* 26.2*  MCV 72.3*  --  72.4*  --  74.5* 77.2* 78.8  --   --  82.1  PLT 205  --  215  --  222 181 183  --   --  165  < > = values in this interval not displayed. Basic Metabolic Panel:  Recent Labs Lab 09/10/16 1008 09/11/16 0701 09/12/16 0633 09/13/16 0517 09/14/16 0417  NA 135 136 138 140 140  K 4.7 4.2 3.9 4.2 3.5  CL 106 108 107 111 106  CO2 23 22 23 24 28   GLUCOSE 156* 149* 134* 169* 128*  BUN 26* 26* 16 32* 18  CREATININE 0.80 0.68 0.72 0.75 0.70  CALCIUM 7.4* 7.8* 8.2* 7.5* 7.6*   GFR: Estimated Creatinine Clearance: 98 mL/min (by C-G formula based on SCr of 0.7 mg/dL). Liver Function Tests:  Recent Labs Lab 09/09/16 2337  AST 20  ALT 13*  ALKPHOS 69  BILITOT 0.4  PROT 5.4*  ALBUMIN 2.7*    Recent Labs Lab 09/09/16 2337  LIPASE 19   No results for input(s): AMMONIA in the last 168 hours. Coagulation Profile:  Recent Labs Lab 09/12/16 2156  INR 1.29    Cardiac Enzymes:  Recent Labs Lab 09/09/16 2337  TROPONINI 0.04*   BNP (last 3 results) No results for input(s): PROBNP in the last 8760 hours. HbA1C: No results for input(s): HGBA1C in the last 72 hours. CBG:  Recent Labs Lab 09/12/16 2204 09/13/16 0712 09/13/16 1123 09/13/16 1612 09/13/16 2154  GLUCAP 134* 154* 147* 136* 123*   Lipid Profile: No results for input(s): CHOL, HDL, LDLCALC, TRIG, CHOLHDL, LDLDIRECT in the last 72 hours. Thyroid Function Tests: No results for input(s): TSH, T4TOTAL, FREET4, T3FREE, THYROIDAB in the last 72 hours. Anemia Panel:  Recent Labs  09/11/16 1011 09/12/16 0633  FERRITIN 10*  --   TIBC  --  402  IRON  --  33*   Sepsis Labs:  Recent Labs Lab 09/09/16 2356 09/10/16 0301 09/10/16 1618 09/10/16 1736  LATICACIDVEN 4.56* 4.76* 1.9 2.3*    Recent Results (from the past 240 hour(s))  Blood Culture (routine x 2)     Status: None (Preliminary result)   Collection Time: 09/10/16 12:16 AM  Result Value Ref Range Status   Specimen Description BLOOD LEFT ARM  Final   Special Requests   Final    BOTTLES DRAWN AEROBIC AND ANAEROBIC Blood Culture adequate volume   Culture NO GROWTH 3 DAYS  Final   Report Status PENDING  Incomplete  Blood Culture (routine x 2)     Status: None (Preliminary result)   Collection Time: 09/10/16 12:24 AM  Result Value Ref Range Status   Specimen Description BLOOD LEFT ARM  Final   Special Requests   Final    BOTTLES DRAWN AEROBIC AND ANAEROBIC Blood Culture adequate volume   Culture NO GROWTH 3 DAYS  Final   Report Status PENDING  Incomplete  Urine culture     Status: None   Collection Time: 09/10/16  3:00 AM  Result Value Ref Range Status   Specimen Description URINE, RANDOM  Final   Special Requests NONE  Final   Culture   Final    NO GROWTH Performed at [facility], [place] [address] [place], [place] [address]    Report Status 09/11/2016 FINAL  Final  MRSA PCR Screening     Status:  None   Collection Time: 09/10/16  4:49 AM  Result Value Ref Range Status   MRSA by PCR NEGATIVE NEGATIVE Final    Comment:        The GeneXpert MRSA Assay (FDA approved for NASAL specimens only), is one component of a comprehensive MRSA colonization surveillance program. It is not intended to diagnose MRSA infection nor to guide or monitor treatment for MRSA infections.          Radiology Studies: No results found.      Scheduled Meds: . citalopram  40 mg Oral Daily  . clonazePAM  0.5 mg Oral QHS  . gabapentin  400 mg Oral TID  . insulin aspart  0-15 Units Subcutaneous TID WC  . insulin aspart  0-5 Units Subcutaneous QHS  . [START ON 09/16/2016] pantoprazole  40 mg Intravenous Q12H  . sodium chloride flush  3 mL Intravenous Q12H   Continuous Infusions: . sodium chloride    . sodium chloride    . pantoprozole (PROTONIX) infusion 8 mg/hr (09/14/16 0600)     LOS: 4 days       Woodson Macha Gerome Apley, MD [facility] Pager [phone]  If 7PM-7AM, please contact night-coverage [email or web address] Password [email or web address] 09/14/2016, 7:49 AM

## 2016-09-15 DIAGNOSIS — K279 Peptic ulcer, site unspecified, unspecified as acute or chronic, without hemorrhage or perforation: Secondary | ICD-10-CM

## 2016-09-15 LAB — BPAM RBC
BLOOD PRODUCT EXPIRATION DATE: 201808092359
Blood Product Expiration Date: 201807282359
Blood Product Expiration Date: 201807282359
ISSUE DATE / TIME: 201807201956
ISSUE DATE / TIME: 201807211151
ISSUE DATE / TIME: 201807211508
UNIT TYPE AND RH: 600
Unit Type and Rh: 600
Unit Type and Rh: 600

## 2016-09-15 LAB — TYPE AND SCREEN
ABO/RH(D): A NEG
ANTIBODY SCREEN: NEGATIVE
UNIT DIVISION: 0
Unit division: 0
Unit division: 0

## 2016-09-15 LAB — CULTURE, BLOOD (ROUTINE X 2)
CULTURE: NO GROWTH
CULTURE: NO GROWTH
SPECIAL REQUESTS: ADEQUATE
Special Requests: ADEQUATE

## 2016-09-15 LAB — CBC WITH DIFFERENTIAL/PLATELET
Basophils Absolute: 0 10*3/uL (ref 0.0–0.1)
Basophils Relative: 0 %
EOS ABS: 0.2 10*3/uL (ref 0.0–0.7)
EOS PCT: 1 %
HCT: 27.2 % — ABNORMAL LOW (ref 39.0–52.0)
Hemoglobin: 9 g/dL — ABNORMAL LOW (ref 13.0–17.0)
LYMPHS ABS: 1.7 10*3/uL (ref 0.7–4.0)
LYMPHS PCT: 14 %
MCH: 27.4 pg (ref 26.0–34.0)
MCHC: 33.1 g/dL (ref 30.0–36.0)
MCV: 82.9 fL (ref 78.0–100.0)
MONOS PCT: 8 %
Monocytes Absolute: 1 10*3/uL (ref 0.1–1.0)
Neutro Abs: 9.4 10*3/uL — ABNORMAL HIGH (ref 1.7–7.7)
Neutrophils Relative %: 77 %
PLATELETS: 192 10*3/uL (ref 150–400)
RBC: 3.28 MIL/uL — ABNORMAL LOW (ref 4.22–5.81)
RDW: 19.4 % — ABNORMAL HIGH (ref 11.5–15.5)
WBC: 12.2 10*3/uL — ABNORMAL HIGH (ref 4.0–10.5)

## 2016-09-15 LAB — GLUCOSE, CAPILLARY
GLUCOSE-CAPILLARY: 160 mg/dL — AB (ref 65–99)
GLUCOSE-CAPILLARY: 143 mg/dL — AB (ref 65–99)
Glucose-Capillary: 115 mg/dL — ABNORMAL HIGH (ref 65–99)

## 2016-09-15 LAB — H. PYLORI ANTIBODY, IGG: H Pylori IgG: <0.8 Index Value (ref 0.00–0.79)

## 2016-09-15 MED ORDER — PANTOPRAZOLE SODIUM 40 MG PO TBEC: 40.0000 mg | DELAYED_RELEASE_TABLET | Freq: Two times a day (BID) | ORAL | 0 refills | Status: DC

## 2016-09-15 NOTE — Evaluation (Signed)
Physical Therapy Evaluation Patient Details Name: Richard Simmons MRN: [id number] DOB: Jun 25, 1945 Today's Date: 09/15/2016   History of Present Illness  Richard Simmons is an 71 y.o. male usually get his care at the [place], with hx of DM on Metformin, neuropathy on Neurontin, and chronic low back pain on narcotics, fell ill this morning with some vertigo and weakness.  He has no HA, nausea, vomtiing, coughs, abdominal pain, rash, fever or chills.   He has no distant travel, ill contact, new meds, or being on steroids.  Evaluation in the ER included a marked leukocytosis with WBC of 21K, Hb of 9 g per dL, and normal LFT and renal Fx tests.  His lactic acid was elevated to 4 and he was found originally hypotensive.  He has no GU complaints.  His UA and CXR was negative.  Fortunately, his BP normalized with IVF.  He was alert, orient, and converse normally.   Hospitalist was asked to admit him for suspicion of sepsis though without source.  He was started on broad spectrum antibiotics after being pan cultured  Clinical Impression  PT has a cane from wood that he uses infrequently.  He has neuropathy and has B braces that he wears but they are at home.  Pt is confident that he will do fine when he gets home.  No further PT needed    Follow Up Recommendations No PT follow up    Equipment Recommendations       Recommendations for Other Services       Precautions / Restrictions Precautions Precautions: None Required Braces or Orthoses: Other Brace/Splint Other Brace/Splint: B LE due to neuropathy but does not have them with him Restrictions Weight Bearing Restrictions: No      Mobility  Bed Mobility Overal bed mobility: Independent                Transfers Overall transfer level: Independent Equipment used: None                Ambulation/Gait Ambulation/Gait assistance: Independent Ambulation Distance (Feet): 100 Feet Assistive device: None Gait Pattern/deviations: WFL(Within  Functional Limits)   Gait velocity interpretation: >2.62 ft/sec, indicative of independent Tourist information centre manager [place] (Stroke Patients Only)       Balance                                             Pertinent Vitals/Pain Pain Assessment: No/denies pain    Home Living Family/patient expects to be discharged to:: Private residence Living Arrangements: Spouse/significant other Available Help at Discharge: Family Type of Home: House Home Access: Stairs to enter Entrance Stairs-Rails: Right Entrance Stairs-Number of Steps: 4 Home Layout: One level Home Equipment: None      Prior Function Level of Independence: Independent               Hand Dominance        Extremity/Trunk Assessment        Lower Extremity Assessment Lower Extremity Assessment: Overall WFL for tasks assessed       Communication   Communication: No difficulties  Cognition Arousal/Alertness: Awake/alert   Overall Cognitive Status: Within Functional Limits for tasks assessed  Assessment/Plan    PT Assessment Patent does not need any further PT services  PT Problem List                          AM-PAC PT "6 Clicks" Daily Activity  Outcome Measure Difficulty turning over in bed (including adjusting bedclothes, sheets and blankets)?: None Difficulty moving from lying on back to sitting on the side of the bed? : None Difficulty sitting down on and standing up from a chair with arms (e.g., wheelchair, bedside commode, etc,.)?: None Help needed moving to and from a bed to chair (including a wheelchair)?: None Help needed walking in hospital room?: None Help needed climbing 3-5 steps with a railing? : A Little 6 Click Score: 23    End of Session Equipment Utilized During Treatment: Gait belt Activity Tolerance: Patient tolerated  treatment well Patient left: in chair Nurse Communication: Mobility status      Time: 8366-2947 PT Time Calculation (min) (ACUTE ONLY): 15 min   Charges:   PT Evaluation $PT Eval Low Complexity: 1 Procedure            Rayetta Humphrey, PT CLT [phone] 09/15/2016, 8:42 AM

## 2016-09-15 NOTE — Discharge Summary (Signed)
Physician Discharge Summary  Chigozie Basaldua SWN:[id number] DOB: 1945/09/26 DOA: 09/09/2016  PCP: Alain Marion, MD  Admit date: 09/09/2016 Discharge date: 09/15/2016  Admitted From: Home Disposition:  Home   Recommendations for Outpatient Follow-up:  1. Follow up with PCP  1 week 2. Patient will need pantoprazole 40 mg po bid for 3 mo 3. Will need follow up EGD in 12 weeks 4. H. Pylori serologies pending  Home Health: No  Equipment/Devices: No   Discharge Condition: Stable  CODE STATUS: Full  Diet recommendation: Heart Healthy / Carb Modified   Brief/Interim Summary: 71 year old male presented with vertigo and weakness. Patient is known to have diabetes mellitus type 2, neuropathy, chronic back pain on narcotics. Apparently he was suffering from an exacerbation of his back pain, decided to lay down on the floor, appropriately he felt dizzy and lightheaded his wife noticed him to be pale and clammy, he was brought into the hospital for further evaluation. Denied any prior symptoms no fevers, no chills, no diarrhea. On physical examination blood pressure 107/59, 81/53, 91/54.heart rate 88-109, respiratory rate 16, oxygen saturation 100%, his mucous membranes were moist, his lungs were clear to auscultation bilaterally, heart S1-S2 present and rhythmic, his abdomen soft nontender, no lower extremity edema and no rashes. Sodium 140, potassium 4.7, chloride 107, bicarbonate 24, glucose 194, BUN 17, creatinine 1.03, AST 20, ALT 13, lactic acid 4.5, white count 21.9, hemoglobin 9.0, hematocrit 29.0, platelets 283, urinalysis negative for infection, urine drug screen positive for opiates, chest x-ray hypoinflated, no effusions, infiltrates or pneumothorax, EKG was sinus rhythm, left axis deviation, poor R-wave progression.  Patient was admitted to hospital with working diagnosis hypotension, systemic inflammatory response syndrome, rule out systemic infection/sepsis.  1. Systemic inflammatory  response syndrome. Extensive workup did not reveal any signs of infection, antibiotic therapy was held.  2. Hypovolemic shock due to upper GI bleed. Patient developed significant hypotension related to upper GI bleed, he required 4 units of packed red blood cells to be transfused. Underwent portion endoscopy if it's show clotted blood as well as fresh blood in the gastric fundus and gastric body. Positive non-bleeding gastric ulcers with adherent clot, treated with heater probe. Multiple nonbleeding duodenal ulcers with no stigmata of bleeding. Patient was placed on a Protonix infusion and transferred to the intensive care unit. He remained hemodynamically stable.   3. Acute on chronic back pain. Patient continue codeine, clonazepam and citalopram.  4. Type 2 diabetes mellitus. Capillary glucose remained well-controlled, he was placed on insulin sliding-scale for glucose coverage and monitoring. Patient will resume metformin at discharge.  Discharge Diagnoses:  Principal Problem:   Sepsis ([place]) Active Problems:   Vertigo   DM (diabetes mellitus) (HCC)   Chronic back pain   Arterial hypotension   Gastrointestinal hemorrhage with melena   Anemia   PUD (peptic ulcer disease)    Discharge Instructions   Allergies as of 09/15/2016   No Known Allergies     Medication List    TAKE these medications   amitriptyline 25 MG tablet Commonly known as:  ELAVIL Take 25 mg by mouth at bedtime. Three tablets by mouth at bedtime.   citalopram 40 MG tablet Commonly known as:  CELEXA Take 40 mg by mouth daily. Take one-half tablet by mouth once daily.   clonazePAM 0.5 MG tablet Commonly known as:  KLONOPIN Take 0.5 mg by mouth at bedtime. Take one tablet by mouth at bedtime for anxiety.   codeine 30 MG tablet Take 60 mg by  mouth every 8 (eight) hours as needed. Take two tablets by mouth every 8 hrs prn.   gabapentin 400 MG capsule Commonly known as:  NEURONTIN Take 400 mg by mouth 3  (three) times daily. Two tablets by mouth three times a day.   metFORMIN 850 MG tablet Commonly known as:  GLUCOPHAGE Take 850 mg by mouth 2 (two) times daily with a meal.   pantoprazole 40 MG tablet Commonly known as:  PROTONIX Take 1 tablet (40 mg total) by mouth 2 (two) times daily before a meal.       No Known Allergies  Consultations:  Gastroenterology   Procedures/Studies: Dg Chest Port 1 View  Result Date: 09/10/2016 CLINICAL DATA:  Weakness EXAM: PORTABLE CHEST 1 VIEW COMPARISON:  None. FINDINGS: There is cardiomegaly without pulmonary edema or focal consolidation. No pleural effusion or pneumothorax. IMPRESSION: Cardiomegaly without active cardiopulmonary disease. Electronically Signed   By: Ulyses Jarred M.D.   On: 09/10/2016 00:12       Subjective: Patient is feeling better, no further bleeding, tolerated by mouth diet adequately.  Discharge Exam: Vitals:   09/15/16 1243 09/15/16 1608  BP:  (!) 136/55  Pulse:    Resp:  20  Temp: 98.9 F (37.2 C) 99 F (37.2 C)   Vitals:   09/15/16 0000 09/15/16 0900 09/15/16 1243 09/15/16 1608  BP:  (!) 123/50  (!) 136/55  Pulse:  99    Resp:  18  20  Temp: 98.1 F (36.7 C) 99.4 F (37.4 C) 98.9 F (37.2 C) 99 F (37.2 C)  TempSrc: Oral Oral Oral Oral  SpO2:  98%  98%  Weight:      Height:        General: Pt is alert, awake, not in acute distress Mild pallor, no icterus, oral mucosa moist Cardiovascular: RRR, S1/S2 +, no rubs, no gallops Respiratory: CTA bilaterally, no wheezing, no rhonchi Abdominal: Soft, NT, ND, bowel sounds + Extremities: no edema, no cyanosis    The results of significant diagnostics from this hospitalization (including imaging, microbiology, ancillary and laboratory) are listed below for reference.     Microbiology: Recent Results (from the past 240 hour(s))  Blood Culture (routine x 2)     Status: None   Collection Time: 09/10/16 12:16 AM  Result Value Ref Range Status    Specimen Description BLOOD LEFT ARM  Final   Special Requests   Final    BOTTLES DRAWN AEROBIC AND ANAEROBIC Blood Culture adequate volume   Culture NO GROWTH 5 DAYS  Final   Report Status 09/15/2016 FINAL  Final  Blood Culture (routine x 2)     Status: None   Collection Time: 09/10/16 12:24 AM  Result Value Ref Range Status   Specimen Description BLOOD LEFT ARM  Final   Special Requests   Final    BOTTLES DRAWN AEROBIC AND ANAEROBIC Blood Culture adequate volume   Culture NO GROWTH 5 DAYS  Final   Report Status 09/15/2016 FINAL  Final  Urine culture     Status: None   Collection Time: 09/10/16  3:00 AM  Result Value Ref Range Status   Specimen Description URINE, RANDOM  Final   Special Requests NONE  Final   Culture   Final    NO GROWTH Performed at [facility], [place] [address], [place], [place] [address]    Report Status 09/11/2016 FINAL  Final  MRSA PCR Screening     Status: None   Collection Time: 09/10/16  4:49 AM  Result Value Ref Range Status   MRSA by PCR NEGATIVE NEGATIVE Final    Comment:        The GeneXpert MRSA Assay (FDA approved for NASAL specimens only), is one component of a comprehensive MRSA colonization surveillance program. It is not intended to diagnose MRSA infection nor to guide or monitor treatment for MRSA infections.      Labs: BNP (last 3 results) No results for input(s): BNP in the last 8760 hours. Basic Metabolic Panel:  Recent Labs Lab 09/10/16 1008 09/11/16 0701 09/12/16 0633 09/13/16 0517 09/14/16 0417  NA 135 136 138 140 140  K 4.7 4.2 3.9 4.2 3.5  CL 106 108 107 111 106  CO2 23 22 23 24 28   GLUCOSE 156* 149* 134* 169* 128*  BUN 26* 26* 16 32* 18  CREATININE 0.80 0.68 0.72 0.75 0.70  CALCIUM 7.4* 7.8* 8.2* 7.5* 7.6*   Liver Function Tests:  Recent Labs Lab 09/09/16 2337  AST 20  ALT 13*  ALKPHOS 69  BILITOT 0.4  PROT 5.4*  ALBUMIN 2.7*    Recent Labs Lab 09/09/16 2337  LIPASE 19   No results for  input(s): AMMONIA in the last 168 hours. CBC:  Recent Labs Lab 09/11/16 1619  09/12/16 8469 09/12/16 2156 09/13/16 0517 09/13/16 1346 09/13/16 1909 09/14/16 0417 09/15/16 0514  WBC 10.6*  --  9.5 15.7* 14.9*  --   --  12.7* 12.2*  NEUTROABS 7.0  --  5.9  --  11.1*  --   --  9.1* 9.4*  HGB 7.3*  < > 8.6* 8.4* 7.9* 7.6* 9.5* 8.8* 9.0*  HCT 23.3*  < > 26.9* 24.7* 24.1* 23.1* 28.3* 26.2* 27.2*  MCV 72.4*  --  74.5* 77.2* 78.8  --   --  82.1 82.9  PLT 215  --  222 181 183  --   --  165 192  < > = values in this interval not displayed. Cardiac Enzymes:  Recent Labs Lab 09/09/16 2337  TROPONINI 0.04*   BNP: Invalid input(s): POCBNP CBG:  Recent Labs Lab 09/14/16 1723 09/14/16 2106 09/15/16 0757 09/15/16 1122 09/15/16 1604  GLUCAP 119* 140* 160* 143* 115*   D-Dimer No results for input(s): DDIMER in the last 72 hours. Hgb A1c No results for input(s): HGBA1C in the last 72 hours. Lipid Profile No results for input(s): CHOL, HDL, LDLCALC, TRIG, CHOLHDL, LDLDIRECT in the last 72 hours. Thyroid function studies No results for input(s): TSH, T4TOTAL, T3FREE, THYROIDAB in the last 72 hours.  Invalid input(s): FREET3 Anemia work up No results for input(s): VITAMINB12, FOLATE, FERRITIN, TIBC, IRON, RETICCTPCT in the last 72 hours. Urinalysis    Component Value Date/Time   COLORURINE YELLOW 09/10/2016 0300   APPEARANCEUR CLEAR 09/10/2016 0300   LABSPEC 1.017 09/10/2016 0300   PHURINE 5.0 09/10/2016 0300   GLUCOSEU NEGATIVE 09/10/2016 0300   HGBUR NEGATIVE 09/10/2016 0300   BILIRUBINUR NEGATIVE 09/10/2016 0300   KETONESUR NEGATIVE 09/10/2016 0300   PROTEINUR NEGATIVE 09/10/2016 0300   NITRITE NEGATIVE 09/10/2016 0300   LEUKOCYTESUR NEGATIVE 09/10/2016 0300   Sepsis Labs Invalid input(s): PROCALCITONIN,  WBC,  LACTICIDVEN Microbiology Recent Results (from the past 240 hour(s))  Blood Culture (routine x 2)     Status: None   Collection Time: 09/10/16 12:16 AM   Result Value Ref Range Status   Specimen Description BLOOD LEFT ARM  Final   Special Requests   Final    BOTTLES DRAWN AEROBIC AND ANAEROBIC Blood  Culture adequate volume   Culture NO GROWTH 5 DAYS  Final   Report Status 09/15/2016 FINAL  Final  Blood Culture (routine x 2)     Status: None   Collection Time: 09/10/16 12:24 AM  Result Value Ref Range Status   Specimen Description BLOOD LEFT ARM  Final   Special Requests   Final    BOTTLES DRAWN AEROBIC AND ANAEROBIC Blood Culture adequate volume   Culture NO GROWTH 5 DAYS  Final   Report Status 09/15/2016 FINAL  Final  Urine culture     Status: None   Collection Time: 09/10/16  3:00 AM  Result Value Ref Range Status   Specimen Description URINE, RANDOM  Final   Special Requests NONE  Final   Culture   Final    NO GROWTH Performed at [facility], [place] [address] [place], [place] [address]    Report Status 09/11/2016 FINAL  Final  MRSA PCR Screening     Status: None   Collection Time: 09/10/16  4:49 AM  Result Value Ref Range Status   MRSA by PCR NEGATIVE NEGATIVE Final    Comment:        The GeneXpert MRSA Assay (FDA approved for NASAL specimens only), is one component of a comprehensive MRSA colonization surveillance program. It is not intended to diagnose MRSA infection nor to guide or monitor treatment for MRSA infections.      Time coordinating discharge: 45 minutes  SIGNED:   Tawni Millers, MD  [facility] 09/15/2016, 5:11 PM Pager   If 7PM-7AM, please contact night-coverage [email or web address] Password [email or web address]

## 2016-09-15 NOTE — Progress Notes (Signed)
    Subjective: Brown stool this morning. No abdominal pain, no N/V. Tolerating diet.   Objective: Vital signs in last 24 hours: Temp:  [98.1 F (36.7 C)-100.1 F (37.8 C)] 98.1 F (36.7 C) (07/24 0000) Pulse Rate:  [86-99] 90 (07/23 2100) Resp:  [16-25] 23 (07/23 2100) BP: (121-142)/(58-75) 128/61 (07/23 2100) SpO2:  [91 %-100 %] 92 % (07/23 2100) Last BM Date: 09/14/16 General:   Alert and oriented, pleasant Head:  Normocephalic and atraumatic. Abdomen:  Bowel sounds present, soft, non-tender, non-distended.  Neurologic:  Alert and  oriented x4 Psych:  Alert and cooperative. Normal mood and affect.  Intake/Output from previous day: 07/23 0701 - 07/24 0700 In: 20 [I.V.:20] Out: -  Intake/Output this shift: No intake/output data recorded.  Lab Results:  Recent Labs  09/13/16 0517  09/13/16 1909 09/14/16 0417 09/15/16 0514  WBC 14.9*  --   --  12.7* 12.2*  HGB 7.9*  < > 9.5* 8.8* 9.0*  HCT 24.1*  < > 28.3* 26.2* 27.2*  PLT 183  --   --  165 192  < > = values in this interval not displayed. BMET  Recent Labs  09/13/16 0517 09/14/16 0417  NA 140 140  K 4.2 3.5  CL 111 106  CO2 24 28  GLUCOSE 169* 128*  BUN 32* 18  CREATININE 0.75 0.70  CALCIUM 7.5* 7.6*   PT/INR  Recent Labs  09/12/16 2156  LABPROT 16.2*  INR 1.29    Assessment: 71 year old male with anemia, hematemesis, melena, with EGD this admission noting non-bleeding gastric ulcer with adherent clot, multiple non-bleeding duodenal ulcers, H.pylori serology pending. Previously endorsing aspirin powder use. Will need Protonix drip through 1600 today, then transitioned to BID PPI for 3 months and once indefinitely thereafter. Will need EGD in Oct 2018. Hgb improved slightly from yesterday, 9. No overt GI bleeding, clinically improved. Hopeful discharge this afternoon or within 24 hours.   Discussed with nursing staff regarding protonix infusion: continue until 1600 today, starting po BID tomorrow.     Plan: Continue Protonix infusion through 1600 today, then start PPI BID dosing BID PPI for 3 months then once daily thereafter EGD in October 2018 Absolute avoidance of NSAIDs, aspirin powders Will follow pending H.pylori serology Will arrange outpatient follow-up with our office   Annitta Needs, PhD, ANP-BC [facility]     LOS: 5 days    09/15/2016, 7:58 AM

## 2016-09-15 NOTE — Care Management Important Message (Signed)
Important Message  Patient Details  Name: Richard Simmons MRN: [id number] Date of Birth: 19-Nov-1945   Medicare Important Message Given:  Yes    Tikisha Molinaro, Chauncey Reading, RN 09/15/2016, 2:12 PM

## 2016-09-15 NOTE — Care Management Note (Signed)
Case Management Note  Patient Details  Name: Richard Simmons MRN: [id number] Date of Birth: September 20, 1945  Subjective/Objective:  Adm with SIRS. From home with family. Ind PTA. Wear braces on legs for neuropathy. Uses self made bamboo cane if needed. No HH PTA. No recommendations by PT. Has PCP, still drives to appointments.                 Action/Plan: Anticipate DC home with self care.    Expected Discharge Date:     09/16/2016             Expected Discharge Plan:  Home/Self Care  In-House Referral:     Discharge planning Services  CM Consult  Post Acute Care Choice:  NA Choice offered to:  NA  DME Arranged:    DME Agency:     HH Arranged:    HH Agency:     Status of Service:  Completed, signed off  If discussed at [employer] of Stay Meetings, dates discussed:    Additional Comments:  Rashawnda Gaba, Chauncey Reading, RN 09/15/2016, 2:05 PM

## 2016-09-16 LAB — TYPE AND SCREEN
ABO/RH(D): A NEG
ANTIBODY SCREEN: NEGATIVE
UNIT DIVISION: 0
UNIT DIVISION: 0
Unit division: 0

## 2016-09-16 LAB — BPAM RBC
BLOOD PRODUCT EXPIRATION DATE: 201808252359
Blood Product Expiration Date: 201808022359
Blood Product Expiration Date: 201808252359
ISSUE DATE / TIME: 201807221522
UNIT TYPE AND RH: 600
Unit Type and Rh: 600
Unit Type and Rh: 9500

## 2016-09-17 ENCOUNTER — Telehealth (INDEPENDENT_AMBULATORY_CARE_PROVIDER_SITE_OTHER): Payer: Self-pay | Admitting: Internal Medicine

## 2016-09-17 NOTE — Telephone Encounter (Signed)
Dr.Rehman was made aware. 

## 2016-09-17 NOTE — Telephone Encounter (Signed)
Patient called, left a message stating that Dr. Laural Golden did surgery on him Saturday night and Dr. Laural Golden wanted him to do a two day callback.  [phone]

## 2016-09-17 NOTE — Telephone Encounter (Signed)
Returned the patient's call. Richard Simmons states that he is doing great. He says that Dr.Rehman is a good doctor and got him patched up good. His stools are looking good , light brown in color.  The first day after procedure he had a BM, that night he felt that he needed to go again and couldn't so he took some of his wife's Miralax, this caused diarrhea . He repeats that he is doing really good.  I ask the patient if he had any problems, questions to call our office and we would be happy to help. That I would make Dr.Rehman aware and if he had any questions I would call him back.

## 2016-09-30 NOTE — ED Notes (Addendum)
Dr Marin Comment, notified of patient's elevated lactic acid 4.76.

## 2016-10-30 ENCOUNTER — Encounter: Payer: Self-pay | Admitting: Nurse Practitioner

## 2016-10-30 ENCOUNTER — Other Ambulatory Visit: Payer: Self-pay

## 2016-10-30 ENCOUNTER — Ambulatory Visit (INDEPENDENT_AMBULATORY_CARE_PROVIDER_SITE_OTHER): Payer: Medicare Other | Admitting: Nurse Practitioner

## 2016-10-30 VITALS — BP 104/66 | HR 113 | Temp 98.1°F | Ht 70.0 in | Wt 207.8 lb

## 2016-10-30 DIAGNOSIS — K279 Peptic ulcer, site unspecified, unspecified as acute or chronic, without hemorrhage or perforation: Secondary | ICD-10-CM

## 2016-10-30 DIAGNOSIS — D649 Anemia, unspecified: Secondary | ICD-10-CM

## 2016-10-30 MED ORDER — PANTOPRAZOLE SODIUM 40 MG PO TBEC: 40.0000 mg | DELAYED_RELEASE_TABLET | Freq: Two times a day (BID) | ORAL | 2 refills | Status: DC

## 2016-10-30 NOTE — Progress Notes (Signed)
Referring Provider: Alain Marion, MD Primary Care Physician:  Alain Marion, MD Primary GI:  Dr. Gala Romney  Chief Complaint  Patient presents with  . EGD    HPI:   Richard Simmons is a 71 y.o. male who presents for follow-up on hospitalization and to schedule surveillance EGD. The patient was admitted to the hospital 09/09/2016 for GI bleed and anemia. He complained of melena stools, hemoglobin found to be 9.0 on admission with no available baseline, platelets 283. Admitted regular use of aspirin powders, typically every day. He was deemed likely upper GI bleed related to NSAID insult.  He underwent upper endoscopy on 09/12/2016 which found normal esophagus, irregular Z line, clotted blood in the gastric fundus and body. One nonbleeding cratered gastric ulcer with adherent clot which was injected with 4 mL of epinephrine solution and coagulation using heater probe. Otherwise, normal stomach. 2 nonbleeding cratered duodenal ulcers with no stigmata of bleeding. Recommended H. pylori serology and repeat EGD in 10-12 weeks. His H. pylori serologies were negative. His 12 week repeat EGD will be due 12/13/2016.  Today he states he;s doing well. Has stopped taking ASA powders. Denies NSAIDs, uses Tylenol as needed. Denies abdominal pain, N/V, hematochezia, melena, fever, chills, unintentional weight loss, acute changes in bowel habits. Las took [employer] 2 weeks prior because d/c Rx only allowed 30 day supply. Denies chest pain, dyspnea, dizziness, lightheadedness, syncope, near syncope. Denies any other upper or lower GI symptoms.  Past Medical History:  Diagnosis Date  . Chronic back pain   . Diabetes mellitus without complication ([place])   . Hearing loss   . Neuropathy     Past Surgical History:  Procedure Laterality Date  . ESOPHAGOGASTRODUODENOSCOPY (EGD) WITH PROPOFOL N/A 09/12/2016   Procedure: ESOPHAGOGASTRODUODENOSCOPY (EGD) WITH PROPOFOL;  Surgeon: Rogene Houston, MD;  Location: [facility]  ENDO SUITE;  Service: Endoscopy;  Laterality: N/A;    Current Outpatient Prescriptions  Medication Sig Dispense Refill  . amitriptyline (ELAVIL) 25 MG tablet Take 25 mg by mouth at bedtime. Three tablets by mouth at bedtime.    . citalopram (CELEXA) 40 MG tablet Take 40 mg by mouth daily. Take one-half tablet by mouth once daily.    . clonazePAM (KLONOPIN) 0.5 MG tablet Take 0.5 mg by mouth at bedtime. Take one tablet by mouth at bedtime for anxiety.    . codeine 30 MG tablet Take 60 mg by mouth every 8 (eight) hours as needed. Take two tablets by mouth every 8 hrs prn.     . gabapentin (NEURONTIN) 400 MG capsule Take 400 mg by mouth 3 (three) times daily. Two tablets by mouth three times a day.    . metFORMIN (GLUCOPHAGE) 850 MG tablet Take 850 mg by mouth 2 (two) times daily with a meal.    . pantoprazole (PROTONIX) 40 MG tablet Take 1 tablet (40 mg total) by mouth 2 (two) times daily before a meal. 60 tablet 0   No current facility-administered medications for this visit.     Allergies as of 10/30/2016  . (No Known Allergies)    Family History  Problem Relation Age of Onset  . Colon cancer Neg Hx   . Gastric cancer Neg Hx   . Esophageal cancer Neg Hx     Social History   Social History  . Marital status: Married    Spouse name: N/A  . Number of children: N/A  . Years of education: N/A   Social History Main Topics  . Smoking  status: Never Smoker  . Smokeless tobacco: Current User    Types: Chew     Comment: occasional chew  . Alcohol use No  . Drug use: No  . Sexual activity: Not Asked   Other Topics Concern  . None   Social History Narrative  . None    Review of Systems: General: Negative for anorexia, weight loss, fever, chills, fatigue, weakness. ENT: Negative for hoarseness, difficulty swallowing. CV: Negative for chest pain, angina, palpitations, peripheral edema.  Respiratory: Negative for dyspnea at rest, cough, sputum, wheezing.  GI: See history of  present illness. Endo: Negative for unusual weight change.  Heme: Negative for bruising or bleeding. Allergy: Negative for rash or hives.   Physical Exam: BP 104/66   Pulse (!) 113   Temp 98.1 F (36.7 C) (Oral)   Ht 5\' 10"  (1.778 m)   Wt 207 lb 12.8 oz (94.3 kg)   BMI 29.82 kg/m  General:   Alert and oriented. Pleasant and cooperative. Well-nourished and well-developed.  Eyes:  Without icterus, sclera clear and conjunctiva pink.  Ears:  Normal auditory acuity. Cardiovascular:  S1, S2 present without murmurs appreciated. Extremities without clubbing or edema. Respiratory:  Clear to auscultation bilaterally. No wheezes, rales, or rhonchi. No distress.  Gastrointestinal:  +BS, soft, non-tender and non-distended. No HSM noted. No guarding or rebound. No masses appreciated.  Rectal:  Deferred  Musculoskalatal:  Symmetrical without gross deformities. Neurologic:  Alert and oriented x4;  grossly normal neurologically. Psych:  Alert and cooperative. Normal mood and affect. Heme/Lymph/Immune: No excessive bruising noted.    10/30/2016 11:23 AM   Disclaimer: This note was dictated with voice recognition software. Similar sounding words can inadvertently be transcribed and may not be corrected upon review.

## 2016-10-30 NOTE — Assessment & Plan Note (Signed)
Noted anemia in the hospital due to significant blood loss related to peptic ulcer disease and duodenal ulcers. His last hemoglobin was in the 9 range. At this point I'll recheck a CBC to ensure his hemoglobin has improved. Return for follow-up in 2 months. EGD as per above.

## 2016-10-30 NOTE — Patient Instructions (Signed)
1. Have your labs checked when you're able to, preferably in the next couple days. 2. We will schedule your repeat endoscopy for you. 3. I will refill your acid blocker for 2 months to take twice a day. At that point he can likely come back to once a day. 4. Return for follow-up in 3 months.

## 2016-10-30 NOTE — Assessment & Plan Note (Signed)
The patient was admitted to the hospital with melena and upon endoscopy found to have peptic ulcer disease and numerous duodenal ulcers. Likely due to NSAID insult from daily aspirin powder use. He is doing well today, no further melena, no nausea or vomiting, no abdominal pain. His appetite has improved significantly. He is no longer taking aspirin powders. Denies NSAIDs. Only uses Tylenol as needed for mild pain. He is due for surveillance endoscopy for ulcer healing. He has not been on PPI for 2 weeks because his discharge medications only allotted for 30 day supply. I will refill his Protonix twice a day for an additional 2 months at which point he can probably back off to once a day. I will also recheck a CBC for anemia as per below. Return for follow-up in 3 months.  Proceed with EGD with Dr. Gala Romney in near future: the risks, benefits, and alternatives have been discussed with the patient in detail. The patient states understanding and desires to proceed.  The patient is not on any anticoagulants, anxiolytics, chronic pain medications, or antidepressants. Conscious sedation should be adequate for his procedure.

## 2016-11-02 NOTE — Progress Notes (Signed)
CC'ED TO PCP 

## 2016-11-26 ENCOUNTER — Encounter (HOSPITAL_COMMUNITY): Payer: Self-pay

## 2016-11-26 ENCOUNTER — Encounter (HOSPITAL_COMMUNITY)
Admission: RE | Disposition: A | Discharge status: Home / Self Care | Payer: Self-pay | Source: Ambulatory Visit | Attending: Internal Medicine

## 2016-11-26 ENCOUNTER — Ambulatory Visit (HOSPITAL_COMMUNITY)
Admission: RE | Admit: 2016-11-26 | Discharge: 2016-11-26 | Disposition: A | Discharge status: Home / Self Care | Payer: Medicare Other | Source: Ambulatory Visit | Attending: Internal Medicine | Admitting: Internal Medicine

## 2016-11-26 ENCOUNTER — Other Ambulatory Visit: Payer: Self-pay | Admitting: General Practice

## 2016-11-26 ENCOUNTER — Encounter: Payer: Self-pay | Admitting: General Practice

## 2016-11-26 ENCOUNTER — Encounter (HOSPITAL_COMMUNITY): Payer: Self-pay | Admitting: *Deleted

## 2016-11-26 ENCOUNTER — Encounter (HOSPITAL_COMMUNITY)
Admit: 2016-11-26 | Discharge: 2016-11-26 | Disposition: A | Discharge status: Home / Self Care | Payer: Medicare Other | Attending: Internal Medicine | Admitting: Internal Medicine

## 2016-11-26 DIAGNOSIS — D509 Iron deficiency anemia, unspecified: Secondary | ICD-10-CM

## 2016-11-26 DIAGNOSIS — R9431 Abnormal electrocardiogram [ECG] [EKG]: Secondary | ICD-10-CM | POA: Diagnosis not present

## 2016-11-26 DIAGNOSIS — K279 Peptic ulcer, site unspecified, unspecified as acute or chronic, without hemorrhage or perforation: Secondary | ICD-10-CM

## 2016-11-26 DIAGNOSIS — D649 Anemia, unspecified: Secondary | ICD-10-CM | POA: Diagnosis not present

## 2016-11-26 DIAGNOSIS — Z01818 Encounter for other preprocedural examination: Secondary | ICD-10-CM | POA: Diagnosis not present

## 2016-11-26 DIAGNOSIS — Z01812 Encounter for preprocedural laboratory examination: Secondary | ICD-10-CM | POA: Diagnosis not present

## 2016-11-26 HISTORY — DX: Anemia, unspecified: D64.9

## 2016-11-26 LAB — CBC WITH DIFFERENTIAL/PLATELET
BASOS ABS: 0 10*3/uL (ref 0.0–0.1)
BASOS PCT: 1 %
EOS ABS: 0.3 10*3/uL (ref 0.0–0.7)
Eosinophils Relative: 5 %
HCT: 28.5 % — ABNORMAL LOW (ref 39.0–52.0)
HEMOGLOBIN: 8.1 g/dL — AB (ref 13.0–17.0)
Lymphocytes Relative: 20 %
Lymphs Abs: 1.2 10*3/uL (ref 0.7–4.0)
MCH: 19.1 pg — ABNORMAL LOW (ref 26.0–34.0)
MCHC: 28.4 g/dL — AB (ref 30.0–36.0)
MCV: 67.4 fL — ABNORMAL LOW (ref 78.0–100.0)
Monocytes Absolute: 0.6 10*3/uL (ref 0.1–1.0)
Monocytes Relative: 9 %
NEUTROS ABS: 4.1 10*3/uL (ref 1.7–7.7)
NEUTROS PCT: 66 %
PLATELETS: 316 10*3/uL (ref 150–400)
RBC: 4.23 MIL/uL (ref 4.22–5.81)
RDW: 20.4 % — ABNORMAL HIGH (ref 11.5–15.5)
WBC: 6.2 10*3/uL (ref 4.0–10.5)

## 2016-11-26 LAB — BASIC METABOLIC PANEL
Anion gap: 8 (ref 5–15)
BUN: 7 mg/dL (ref 6–20)
CHLORIDE: 101 mmol/L (ref 101–111)
CO2: 28 mmol/L (ref 22–32)
CREATININE: 0.76 mg/dL (ref 0.61–1.24)
Calcium: 8.7 mg/dL — ABNORMAL LOW (ref 8.9–10.3)
Glucose, Bld: 151 mg/dL — ABNORMAL HIGH (ref 65–99)
POTASSIUM: 4.2 mmol/L (ref 3.5–5.1)
SODIUM: 137 mmol/L (ref 135–145)

## 2016-11-26 SURGERY — EGD (ESOPHAGOGASTRODUODENOSCOPY)
Anesthesia: Moderate Sedation

## 2016-11-26 MED ORDER — SODIUM CHLORIDE 0.9 % IV SOLN: INTRAVENOUS | Status: DC

## 2016-11-26 NOTE — Pre-Procedure Instructions (Signed)
Dr Gala Romney also notified of H&H. No orders given.

## 2016-11-26 NOTE — Pre-Procedure Instructions (Signed)
H&H reported to Walden Field, NP.

## 2016-11-26 NOTE — Patient Instructions (Addendum)
Richard Simmons  11/26/2016     @PREFPERIOPPHARMACY @   Your procedure is scheduled on  11/30/2016   Report to [facility] at  43 A.M.  Call this number if you have problems the morning of surgery:  [phone]   Remember:  Do not eat food or drink liquids after midnight.  Take these medicines the morning of surgery with A SIP OF WATER  Celexa, codeine, neurontin, protonix. DO NOT take any medications for diabetes the morning of your procedure.   Do not wear jewelry, make-up or nail polish.  Do not wear lotions, powders, or perfumes, or deoderant.  Do not shave 48 hours prior to surgery.  Men may shave face and neck.  Do not bring valuables to the hospital.  [facility] is not responsible for any belongings or valuables.  Contacts, dentures or bridgework may not be worn into surgery.  Leave your suitcase in the car.  After surgery it may be brought to your room.  For patients admitted to the hospital, discharge time will be determined by your treatment team.  Patients discharged the day of surgery will not be allowed to drive home.   Name and phone number of your driver:   family Special instructions:  Follow the diet instructions given to you by Dr Roseanne Kaufman office.  Please read over the following fact sheets that you were given. Anesthesia Post-op Instructions and Care and Recovery After Surgery       Esophagogastroduodenoscopy Esophagogastroduodenoscopy (EGD) is a procedure to examine the lining of the esophagus, stomach, and first part of the small intestine (duodenum). This procedure is done to check for problems such as inflammation, bleeding, ulcers, or growths. During this procedure, a long, flexible, lighted tube with a camera attached (endoscope) is inserted down the throat. Tell a health care provider about:  Any allergies you have.  All medicines you are taking, including vitamins, herbs, eye drops, creams, and over-the-counter medicines.  Any  problems you or family members have had with anesthetic medicines.  Any blood disorders you have.  Any surgeries you have had.  Any medical conditions you have.  Whether you are pregnant or may be pregnant. What are the risks? Generally, this is a safe procedure. However, problems may occur, including:  Infection.  Bleeding.  A tear (perforation) in the esophagus, stomach, or duodenum.  Trouble breathing.  Excessive sweating.  Spasms of the larynx.  A slowed heartbeat.  Low blood pressure.  What happens before the procedure?  Follow instructions from your health care provider about eating or drinking restrictions.  Ask your health care provider about: ? Changing or stopping your regular medicines. This is especially important if you are taking diabetes medicines or blood thinners. ? Taking medicines such as aspirin and ibuprofen. These medicines can thin your blood. Do not take these medicines before your procedure if your health care provider instructs you not to.  Plan to have someone take you home after the procedure.  If you wear dentures, be ready to remove them before the procedure. What happens during the procedure?  To reduce your risk of infection, your health care team will wash or sanitize their hands.  An IV tube will be put in a vein in your hand or arm. You will get medicines and fluids through this tube.  You will be given one or more of the following: ? A medicine to help you relax (sedative). ? A medicine to numb the  area (local anesthetic). This medicine may be sprayed into your throat. It will make you feel more comfortable and keep you from gagging or coughing during the procedure. ? A medicine for pain.  A mouth guard may be placed in your mouth to protect your teeth and to keep you from biting on the endoscope.  You will be asked to lie on your left side.  The endoscope will be lowered down your throat into your esophagus, stomach, and  duodenum.  Air will be put into the endoscope. This will help your health care provider see better.  The lining of your esophagus, stomach, and duodenum will be examined.  Your health care provider may: ? Take a tissue sample so it can be looked at in a lab (biopsy). ? Remove growths. ? Remove objects (foreign bodies) that are stuck. ? Treat any bleeding with medicines or other devices that stop tissue from bleeding. ? Widen (dilate) or stretch narrowed areas of your esophagus and stomach.  The endoscope will be taken out. The procedure may vary among health care providers and hospitals. What happens after the procedure?  Your blood pressure, heart rate, breathing rate, and blood oxygen level will be monitored often until the medicines you were given have worn off.  Do not eat or drink anything until the numbing medicine has worn off and your gag reflex has returned. This information is not intended to replace advice given to you by your health care provider. Make sure you discuss any questions you have with your health care provider. Document Released: 06/12/2004 Document Revised: 07/18/2015 Document Reviewed: 01/03/2015 Elsevier Interactive Patient Education  2018 [employer]. Esophagogastroduodenoscopy, Care After Refer to this sheet in the next few weeks. These instructions provide you with information about caring for yourself after your procedure. Your health care provider may also give you more specific instructions. Your treatment has been planned according to current medical practices, but problems sometimes occur. Call your health care provider if you have any problems or questions after your procedure. What can I expect after the procedure? After the procedure, it is common to have:  A sore throat.  Nausea.  Bloating.  Dizziness.  Fatigue.  Follow these instructions at home:  Do not eat or drink anything until the numbing medicine (local anesthetic) has worn off  and your gag reflex has returned. You will know that the local anesthetic has worn off when you can swallow comfortably.  Do not drive for 24 hours if you received a medicine to help you relax (sedative).  If your health care provider took a tissue sample for testing during the procedure, make sure to get your test results. This is your responsibility. Ask your health care provider or the department performing the test when your results will be ready.  Keep all follow-up visits as told by your health care provider. This is important. Contact a health care provider if:  You cannot stop coughing.  You are not urinating.  You are urinating less than usual. Get help right away if:  You have trouble swallowing.  You cannot eat or drink.  You have throat or chest pain that gets worse.  You are dizzy or light-headed.  You faint.  You have nausea or vomiting.  You have chills.  You have a fever.  You have severe abdominal pain.  You have black, tarry, or bloody stools. This information is not intended to replace advice given to you by your health care provider. Make sure you  discuss any questions you have with your health care provider. Document Released: 01/27/2012 Document Revised: 07/18/2015 Document Reviewed: 01/03/2015 Elsevier Interactive Patient Education  2018 [place] Anesthesia is a term that refers to techniques, procedures, and medicines that help a person stay safe and comfortable during a medical procedure. Monitored anesthesia care, or sedation, is one type of anesthesia. Your anesthesia specialist may recommend sedation if you will be having a procedure that does not require you to be unconscious, such as:  Cataract surgery.  A dental procedure.  A biopsy.  A colonoscopy.  During the procedure, you may receive a medicine to help you relax (sedative). There are three levels of sedation:  Mild sedation. At this level, you may  feel awake and relaxed. You will be able to follow directions.  Moderate sedation. At this level, you will be sleepy. You may not remember the procedure.  Deep sedation. At this level, you will be asleep. You will not remember the procedure.  The more medicine you are given, the deeper your level of sedation will be. Depending on how you respond to the procedure, the anesthesia specialist may change your level of sedation or the type of anesthesia to fit your needs. An anesthesia specialist will monitor you closely during the procedure. Let your health care provider know about:  Any allergies you have.  All medicines you are taking, including vitamins, herbs, eye drops, creams, and over-the-counter medicines.  Any use of steroids (by mouth or as a cream).  Any problems you or family members have had with sedatives and anesthetic medicines.  Any blood disorders you have.  Any surgeries you have had.  Any medical conditions you have, such as sleep apnea.  Whether you are pregnant or may be pregnant.  Any use of cigarettes, alcohol, or street drugs. What are the risks? Generally, this is a safe procedure. However, problems may occur, including:  Getting too much medicine (oversedation).  Nausea.  Allergic reaction to medicines.  Trouble breathing. If this happens, a breathing tube may be used to help with breathing. It will be removed when you are awake and breathing on your own.  Heart trouble.  Lung trouble.  Before the procedure Staying hydrated Follow instructions from your health care provider about hydration, which may include:  Up to 2 hours before the procedure - you may continue to drink clear liquids, such as water, clear fruit juice, black coffee, and plain tea.  Eating and drinking restrictions Follow instructions from your health care provider about eating and drinking, which may include:  8 hours before the procedure - stop eating heavy meals or foods such  as meat, fried foods, or fatty foods.  6 hours before the procedure - stop eating light meals or foods, such as toast or cereal.  6 hours before the procedure - stop drinking milk or drinks that contain milk.  2 hours before the procedure - stop drinking clear liquids.  Medicines Ask your health care provider about:  Changing or stopping your regular medicines. This is especially important if you are taking diabetes medicines or blood thinners.  Taking medicines such as aspirin and ibuprofen. These medicines can thin your blood. Do not take these medicines before your procedure if your health care provider instructs you not to.  Tests and exams  You will have a physical exam.  You may have blood tests done to show: ? How well your kidneys and liver are working. ? How well your  blood can clot.  General instructions  Plan to have someone take you home from the hospital or clinic.  If you will be going home right after the procedure, plan to have someone with you for 24 hours.  What happens during the procedure?  Your blood pressure, heart rate, breathing, level of pain and overall condition will be monitored.  An IV tube will be inserted into one of your veins.  Your anesthesia specialist will give you medicines as needed to keep you comfortable during the procedure. This may mean changing the level of sedation.  The procedure will be performed. After the procedure  Your blood pressure, heart rate, breathing rate, and blood oxygen level will be monitored until the medicines you were given have worn off.  Do not drive for 24 hours if you received a sedative.  You may: ? Feel sleepy, clumsy, or nauseous. ? Feel forgetful about what happened after the procedure. ? Have a sore throat if you had a breathing tube during the procedure. ? Vomit. This information is not intended to replace advice given to you by your health care provider. Make sure you discuss any questions you  have with your health care provider. Document Released: 11/05/2004 Document Revised: 07/19/2015 Document Reviewed: 06/02/2015 Elsevier Interactive Patient Education  2018 [place], Care After These instructions provide you with information about caring for yourself after your procedure. Your health care provider may also give you more specific instructions. Your treatment has been planned according to current medical practices, but problems sometimes occur. Call your health care provider if you have any problems or questions after your procedure. What can I expect after the procedure? After your procedure, it is common to:  Feel sleepy for several hours.  Feel clumsy and have poor balance for several hours.  Feel forgetful about what happened after the procedure.  Have poor judgment for several hours.  Feel nauseous or vomit.  Have a sore throat if you had a breathing tube during the procedure.  Follow these instructions at home: For at least 24 hours after the procedure:   Do not: ? Participate in activities in which you could fall or become injured. ? Drive. ? Use heavy machinery. ? Drink alcohol. ? Take sleeping pills or medicines that cause drowsiness. ? Make important decisions or sign legal documents. ? Take care of children on your own.  Rest. Eating and drinking  Follow the diet that is recommended by your health care provider.  If you vomit, drink water, juice, or soup when you can drink without vomiting.  Make sure you have little or no nausea before eating solid foods. General instructions  Have a responsible adult stay with you until you are awake and alert.  Take over-the-counter and prescription medicines only as told by your health care provider.  If you smoke, do not smoke without supervision.  Keep all follow-up visits as told by your health care provider. This is important. Contact a health care provider if:  You  keep feeling nauseous or you keep vomiting.  You feel light-headed.  You develop a rash.  You have a fever. Get help right away if:  You have trouble breathing. This information is not intended to replace advice given to you by your health care provider. Make sure you discuss any questions you have with your health care provider. Document Released: 06/02/2015 Document Revised: 10/02/2015 Document Reviewed: 06/02/2015 Elsevier Interactive Patient Education  [employer].

## 2016-11-27 NOTE — Progress Notes (Signed)
Dr. Duwayne Heck notified of patient's latest hemoglobin and hematocrit.

## 2016-11-30 ENCOUNTER — Ambulatory Visit (HOSPITAL_COMMUNITY): Payer: Medicare Other | Admitting: Anesthesiology

## 2016-11-30 ENCOUNTER — Ambulatory Visit (HOSPITAL_COMMUNITY)
Admission: RE | Admit: 2016-11-30 | Discharge: 2016-11-30 | Disposition: A | Discharge status: Home / Self Care | Payer: Medicare Other | Source: Ambulatory Visit | Attending: Internal Medicine | Admitting: Internal Medicine

## 2016-11-30 ENCOUNTER — Encounter (HOSPITAL_COMMUNITY): Payer: Self-pay | Admitting: Anesthesiology

## 2016-11-30 ENCOUNTER — Encounter (HOSPITAL_COMMUNITY)
Admission: RE | Disposition: A | Discharge status: Home / Self Care | Payer: Self-pay | Source: Ambulatory Visit | Attending: Internal Medicine

## 2016-11-30 DIAGNOSIS — K449 Diaphragmatic hernia without obstruction or gangrene: Secondary | ICD-10-CM | POA: Diagnosis not present

## 2016-11-30 DIAGNOSIS — Z7984 Long term (current) use of oral hypoglycemic drugs: Secondary | ICD-10-CM | POA: Diagnosis not present

## 2016-11-30 DIAGNOSIS — K279 Peptic ulcer, site unspecified, unspecified as acute or chronic, without hemorrhage or perforation: Secondary | ICD-10-CM | POA: Diagnosis not present

## 2016-11-30 DIAGNOSIS — K259 Gastric ulcer, unspecified as acute or chronic, without hemorrhage or perforation: Secondary | ICD-10-CM | POA: Diagnosis not present

## 2016-11-30 DIAGNOSIS — K3189 Other diseases of stomach and duodenum: Secondary | ICD-10-CM

## 2016-11-30 DIAGNOSIS — G8929 Other chronic pain: Secondary | ICD-10-CM | POA: Diagnosis not present

## 2016-11-30 DIAGNOSIS — Z79899 Other long term (current) drug therapy: Secondary | ICD-10-CM | POA: Diagnosis not present

## 2016-11-30 DIAGNOSIS — E1140 Type 2 diabetes mellitus with diabetic neuropathy, unspecified: Secondary | ICD-10-CM | POA: Insufficient documentation

## 2016-11-30 DIAGNOSIS — D509 Iron deficiency anemia, unspecified: Secondary | ICD-10-CM

## 2016-11-30 DIAGNOSIS — D649 Anemia, unspecified: Secondary | ICD-10-CM | POA: Diagnosis not present

## 2016-11-30 HISTORY — PX: ESOPHAGOGASTRODUODENOSCOPY (EGD) WITH PROPOFOL: SHX5813

## 2016-11-30 LAB — GLUCOSE, CAPILLARY: GLUCOSE-CAPILLARY: 149 mg/dL — AB (ref 65–99)

## 2016-11-30 SURGERY — ESOPHAGOGASTRODUODENOSCOPY (EGD) WITH PROPOFOL
Anesthesia: Monitor Anesthesia Care

## 2016-11-30 MED ORDER — MIDAZOLAM HCL 2 MG/2ML IJ SOLN
INTRAMUSCULAR | Status: AC
  Filled 2016-11-30: qty 2

## 2016-11-30 MED ORDER — PROPOFOL 500 MG/50ML IV EMUL
INTRAVENOUS | Status: DC | PRN
  Administered 2016-11-30: 150 ug/kg/min via INTRAVENOUS

## 2016-11-30 MED ORDER — LIDOCAINE VISCOUS 2 % MT SOLN
OROMUCOSAL | Status: AC
  Filled 2016-11-30: qty 15

## 2016-11-30 MED ORDER — LIDOCAINE VISCOUS 2 % MT SOLN
3.0000 mL | Freq: Once | OROMUCOSAL | Status: AC
  Administered 2016-11-30: 3 mL via OROMUCOSAL

## 2016-11-30 MED ORDER — FENTANYL CITRATE (PF) 100 MCG/2ML IJ SOLN
25.0000 ug | Freq: Once | INTRAMUSCULAR | Status: AC
  Administered 2016-11-30: 25 ug via INTRAVENOUS

## 2016-11-30 MED ORDER — LACTATED RINGERS IV SOLN
INTRAVENOUS | Status: DC
  Administered 2016-11-30: 09:00:00 via INTRAVENOUS

## 2016-11-30 MED ORDER — CHLORHEXIDINE GLUCONATE CLOTH 2 % EX PADS: 6.0000 | MEDICATED_PAD | Freq: Once | CUTANEOUS | Status: DC

## 2016-11-30 MED ORDER — MIDAZOLAM HCL 2 MG/2ML IJ SOLN
1.0000 mg | INTRAMUSCULAR | Status: AC
  Administered 2016-11-30: 2 mg via INTRAVENOUS

## 2016-11-30 MED ORDER — FENTANYL CITRATE (PF) 100 MCG/2ML IJ SOLN
INTRAMUSCULAR | Status: AC
  Filled 2016-11-30: qty 2

## 2016-11-30 NOTE — H&P (Signed)
@LOGO @   Primary Care Physician:  Alain Marion, MD Primary Gastroenterologist:  Dr. Gala Romney  Pre-Procedure History & Physical: HPI:  Richard Simmons is a 71 y.o. male here for surveillance EGD. History of gastric and duodenal ulceration secondary to NSAIDs 3 months ago. Also required endoscopic bleeding control therapy. H. pylori serologies negative. He is here for surveillance examination under propofol.  Past Medical History:  Diagnosis Date  . Anemia   . Chronic back pain   . Diabetes mellitus without complication (B and E)   . Hearing loss   . Neuropathy     Past Surgical History:  Procedure Laterality Date  . ESOPHAGOGASTRODUODENOSCOPY (EGD) WITH PROPOFOL N/A 09/12/2016   Procedure: ESOPHAGOGASTRODUODENOSCOPY (EGD) WITH PROPOFOL;  Surgeon: Rogene Houston, MD;  Location: [facility];  Service: Endoscopy;  Laterality: N/A;    Prior to Admission medications   Medication Sig Start Date End Date Taking? Authorizing Provider  amitriptyline (ELAVIL) 25 MG tablet Take 75 mg by mouth at bedtime.    Yes [provider]  citalopram (CELEXA) 40 MG tablet Take 20 mg by mouth daily.    Yes [provider]  clonazePAM (KLONOPIN) 0.5 MG tablet Take 0.5 mg by mouth at bedtime.    Yes [provider]  codeine 30 MG tablet Take 30 mg by mouth every 8 (eight) hours as needed for moderate pain.    Yes [provider]  gabapentin (NEURONTIN) 400 MG capsule Take 400 mg by mouth 2 (two) times daily.    Yes [provider]  metFORMIN (GLUCOPHAGE) 850 MG tablet Take 850 mg by mouth 2 (two) times daily with a meal.   Yes [provider]  pantoprazole (PROTONIX) 40 MG tablet Take 1 tablet (40 mg total) by mouth 2 (two) times daily before a meal. 10/30/16  Yes Carlis Stable, NP    Allergies as of 11/26/2016  . (No Known Allergies)    Family History  Problem Relation Age of Onset  . Colon cancer Neg Hx   . Gastric cancer Neg Hx   . Esophageal cancer  Neg Hx     Social History   Social History  . Marital status: Married    Spouse name: N/A  . Number of children: N/A  . Years of education: N/A   Occupational History  . Not on file.   Social History Main Topics  . Smoking status: Never Smoker  . Smokeless tobacco: Current User    Types: Chew     Comment: occasional chew  . Alcohol use No  . Drug use: No  . Sexual activity: Not on file   Other Topics Concern  . Not on file   Social History Narrative  . No narrative on file    Review of Systems: See HPI, otherwise negative ROS  Physical Exam: BP 125/76   Temp 98.1 F (36.7 C)   Resp 15   SpO2 98%  General:   Alert,  Well-developed, well-nourished, pleasant and cooperative in NAD Neck:  Supple; no masses or thyromegaly. No significant cervical adenopathy. Lungs:  Clear throughout to auscultation.   No wheezes, crackles, or rhonchi. No acute distress. Heart:  Regular rate and rhythm; no murmurs, clicks, rubs,  or gallops. Abdomen: Non-distended, normal bowel sounds.  Soft and nontender without appreciable mass or hepatosplenomegaly.  Pulses:  Normal pulses noted. Extremities:  Without clubbing or edema.   Impression:  71 year old gentleman history of peptic ulcer disease NSAID related requiring bleeding control therapy 3 months ago.  Doing well. Needs surveillance EGD.   Recommendations:  I have offered the patient a surveillance EGD today per plan.  The risks, benefits, limitations, alternatives and imponderables have been reviewed with the patient. Potential for esophageal dilation, biopsy, etc. have also been reviewed.  Questions have been answered. All parties agreeable.    Notice: This dictation was prepared with Dragon dictation along with smaller phrase technology. Any transcriptional errors that result from this process are unintentional and may not be corrected upon review.

## 2016-11-30 NOTE — Op Note (Signed)
[facility] Patient Name: Richard Simmons Procedure Date: 11/30/2016 9:13 AM MRN: [id number] Date of Birth: 03-10-1945 Attending MD: [name] , MD CSN: [id number] Age: 71 Admit Type: Outpatient Procedure:                Upper GI endoscopy Indications:              Surveillance procedure Providers:                [name], MD, Selena Lesser, Randa Spike, Technician Referring MD:              Medicines:                Propofol per Anesthesia Complications:            No immediate complications. Estimated Blood Loss:     Estimated blood loss: none. Procedure:                Pre-Anesthesia Assessment:                           - Prior to the procedure, a History and Physical                            was performed, and patient medications and                            allergies were reviewed. The patient's tolerance of                            previous anesthesia was also reviewed. The risks                            and benefits of the procedure and the sedation                            options and risks were discussed with the patient.                            All questions were answered, and informed consent                            was obtained. Prior Anticoagulants: The patient has                            taken no previous anticoagulant or antiplatelet                            agents. After reviewing the risks and benefits, the                            patient was deemed in satisfactory condition to  undergo the procedure.                           After obtaining informed consent, the endoscope was                            passed under direct vision. Throughout the                            procedure, the patient's blood pressure, pulse, and                            oxygen saturations were monitored continuously. The                            [id number]) scope was  introduced through the                            mouth, and advanced to the second part of duodenum.                            The upper GI endoscopy was accomplished without                            difficulty. The patient tolerated the procedure                            well. Scope In: 9:34:19 AM Scope Out: 9:37:28 AM Total Procedure Duration: 0 hours 3 minutes 9 seconds  Findings:      The examined esophagus was normal.      A small hiatal hernia was present.      Mucosal changes were found in the stomach. Scar through the pyloric       channel into the duodenal bulb as well as at the angularis/lesser       curvature - sites of prior ulcers?healed Impression:               - Normal esophagus.                           - Small hiatal hernia.                           - gastric and duodenal scar?previously noted ulcers                            healed.                           - No specimens collected. Moderate Sedation:      Moderate (conscious) sedation was personally administered by an       anesthesia professional. The following parameters were monitored: oxygen       saturation, heart rate, blood pressure, respiratory rate, EKG, adequacy       of pulmonary ventilation, and response to care. Total physician       intraservice time was 12 minutes. Recommendation:           -  Patient has a contact number available for                            emergencies. The signs and symptoms of potential                            delayed complications were discussed with the                            patient. Return to normal activities tomorrow.                            Written discharge instructions were provided to the                            patient.                           - Continue present medications. Decrease Protonix                            to 40 mg once daily. Avoid nonsteroidal agents.                           - No repeat upper endoscopy.                            - Return to GI office in 6 months.                           - Advance diet as tolerated. Procedure Code(s):        --- Professional ---                           [phone], Esophagogastroduodenoscopy, flexible,                            transoral; diagnostic, including collection of                            specimen(s) by brushing or washing, when performed                            (separate procedure) Diagnosis Code(s):        --- Professional ---                           K44.9, Diaphragmatic hernia without obstruction or                            gangrene                           K31.89, Other diseases of stomach and duodenum CPT copyright 2016 American Medical Association. All rights reserved. The codes documented in this report are preliminary and upon coder review may  be revised to  meet current compliance requirements. [name]. [name], MD [name], MD 11/30/2016 9:57:47 AM This report has been signed electronically. Number of Addenda: 0

## 2016-11-30 NOTE — Discharge Instructions (Addendum)
EGD Discharge instructions Please read the instructions outlined below and refer to this sheet in the next few weeks. These discharge instructions provide you with general information on caring for yourself after you leave the hospital. Your doctor may also give you specific instructions. While your treatment has been planned according to the most current medical practices available, unavoidable complications occasionally occur. If you have any problems or questions after discharge, please call your doctor. ACTIVITY  You may resume your regular activity but move at a slower pace for the next 24 hours.   Take frequent rest periods for the next 24 hours.   Walking will help expel (get rid of) the air and reduce the bloated feeling in your abdomen.   No driving for 24 hours (because of the anesthesia (medicine) used during the test).   You may shower.   Do not sign any important legal documents or operate any machinery for 24 hours (because of the anesthesia used during the test).  NUTRITION  Drink plenty of fluids.   You may resume your normal diet.   Begin with a light meal and progress to your normal diet.   Avoid alcoholic beverages for 24 hours or as instructed by your caregiver.  MEDICATIONS  You may resume your normal medications unless your caregiver tells you otherwise.  WHAT YOU CAN EXPECT TODAY  You may experience abdominal discomfort such as a feeling of fullness or gas pains.  FOLLOW-UP  Your doctor will discuss the results of your test with you.  SEEK IMMEDIATE MEDICAL ATTENTION IF ANY OF THE FOLLOWING OCCUR:  Excessive nausea (feeling sick to your stomach) and/or vomiting.   Severe abdominal pain and distention (swelling).   Trouble swallowing.   Temperature over 101 F (37.8 C).   Rectal bleeding or vomiting of blood.   Decrease Protonix to 40 mg once daily  Avoid nonsteroidal agents like Advil and Aleve.  Office visit with [place] in 6 months

## 2016-11-30 NOTE — Anesthesia Procedure Notes (Signed)
Procedure Name: MAC Date/Time: 11/30/2016 9:26 AM Performed by: Andree Elk, Audryana Hockenberry A Pre-anesthesia Checklist: Patient identified, Emergency Drugs available, Suction available, Patient being monitored and Timeout performed Oxygen Delivery Method: Simple face mask

## 2016-11-30 NOTE — Anesthesia Postprocedure Evaluation (Signed)
Anesthesia Post Note  Patient: Richard Simmons  Procedure(s) Performed: ESOPHAGOGASTRODUODENOSCOPY (EGD) WITH PROPOFOL (N/A )  Patient location during evaluation: PACU Anesthesia Type: MAC Level of consciousness: awake, oriented and patient cooperative Pain management: pain level controlled Vital Signs Assessment: post-procedure vital signs reviewed and stable Respiratory status: spontaneous breathing, respiratory function stable and patient connected to nasal cannula oxygen Cardiovascular status: stable Postop Assessment: no apparent nausea or vomiting Anesthetic complications: no     Last Vitals:  Vitals:   11/30/16 0910 11/30/16 0915  BP:    Resp: 12 16  Temp:    SpO2: 98% 99%    Last Pain:  Vitals:   11/30/16 0835  PainSc: 0-No pain                 ADAMS, AMY A

## 2016-11-30 NOTE — Transfer of Care (Signed)
Immediate Anesthesia Transfer of Care Note  Patient: Richard Simmons  Procedure(s) Performed: ESOPHAGOGASTRODUODENOSCOPY (EGD) WITH PROPOFOL (N/A )  Patient Location: PACU  Anesthesia Type:MAC  Level of Consciousness: awake, oriented and patient cooperative  Airway & Oxygen Therapy: Patient Spontanous Breathing and Patient connected to nasal cannula oxygen  Post-op Assessment: Report given to RN and Post -op Vital signs reviewed and stable  Post vital signs: Reviewed and stable  Last Vitals:  Vitals:   11/30/16 0910 11/30/16 0915  BP:    Resp: 12 16  Temp:    SpO2: 98% 99%    Last Pain:  Vitals:   11/30/16 0835  PainSc: 0-No pain         Complications: No apparent anesthesia complications

## 2016-11-30 NOTE — Anesthesia Preprocedure Evaluation (Signed)
Anesthesia Evaluation  Patient identified by MRN, date of birth, ID band Patient awake    Airway Mallampati: II  TM Distance: >3 FB     Dental  (+) Edentulous Upper, Edentulous Lower   Pulmonary    breath sounds clear to auscultation       Cardiovascular Normal cardiovascular exam Rhythm:Regular Rate:Normal     Neuro/Psych    GI/Hepatic PUD, GI Bleed   Endo/Other  diabetes, Well Controlled, Type 2  Renal/GU      Musculoskeletal   Abdominal   Peds  Hematology  (+) anemia ,   Anesthesia Other Findings   Reproductive/Obstetrics                             Anesthesia Physical Anesthesia Plan  ASA: III  Anesthesia Plan: MAC   Post-op Pain Management:    Induction: Intravenous  PONV Risk Score and Plan:   Airway Management Planned: Simple Face Mask  Additional Equipment:   Intra-op Plan:   Post-operative Plan:   Informed Consent: I have reviewed the patients History and Physical, chart, labs and discussed the procedure including the risks, benefits and alternatives for the proposed anesthesia with the patient or authorized representative who has indicated his/her understanding and acceptance.     Plan Discussed with:   Anesthesia Plan Comments:         Anesthesia Quick Evaluation

## 2016-12-03 ENCOUNTER — Encounter (HOSPITAL_COMMUNITY): Payer: Self-pay | Admitting: Internal Medicine

## 2016-12-21 ENCOUNTER — Other Ambulatory Visit: Payer: Self-pay | Admitting: Nurse Practitioner

## 2016-12-21 DIAGNOSIS — K279 Peptic ulcer, site unspecified, unspecified as acute or chronic, without hemorrhage or perforation: Secondary | ICD-10-CM

## 2016-12-21 DIAGNOSIS — D649 Anemia, unspecified: Secondary | ICD-10-CM

## 2017-02-02 ENCOUNTER — Ambulatory Visit: Payer: Medicare Other | Admitting: Nurse Practitioner

## 2017-04-27 ENCOUNTER — Encounter: Payer: Self-pay | Admitting: Internal Medicine

## 2017-05-03 ENCOUNTER — Telehealth: Payer: Self-pay

## 2017-05-03 NOTE — Telephone Encounter (Signed)
Pt's wife called office. Pt received letter to schedule f/u appt. She said he is being seen by the [place] in [place] and will be going there from now on.

## 2017-05-04 NOTE — Telephone Encounter (Signed)
Noted  

## 2017-09-24 DIAGNOSIS — M549 Dorsalgia, unspecified: Secondary | ICD-10-CM | POA: Diagnosis not present

## 2017-09-27 ENCOUNTER — Ambulatory Visit (INDEPENDENT_AMBULATORY_CARE_PROVIDER_SITE_OTHER): Payer: Non-veteran care | Admitting: Podiatry

## 2017-09-27 DIAGNOSIS — E0800 Diabetes mellitus due to underlying condition with hyperosmolarity without nonketotic hyperglycemic-hyperosmolar coma (NKHHC): Secondary | ICD-10-CM

## 2017-09-27 DIAGNOSIS — M2041 Other hammer toe(s) (acquired), right foot: Secondary | ICD-10-CM

## 2017-09-27 DIAGNOSIS — M2042 Other hammer toe(s) (acquired), left foot: Secondary | ICD-10-CM

## 2017-09-27 DIAGNOSIS — B351 Tinea unguium: Secondary | ICD-10-CM

## 2017-09-27 NOTE — Progress Notes (Signed)
SUBJECTIVE: 72 y.o. year old male presents requesting a diabetic shoe. Also having problem with left great toe nail.  Review of Systems  Constitutional: Negative.   HENT:       Wear hearing aids.  Eyes: Negative.   Respiratory: Negative.   Cardiovascular: Negative.   Gastrointestinal: Negative.   Genitourinary: Negative.   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  Skin: Negative.      OBJECTIVE: DERMATOLOGIC EXAMINATION: Thick and deformed left great toe nail.  VASCULAR EXAMINATION OF LOWER LIMBS: All pedal pulses are palpable with normal pulsation.  No edema or erythema noted. Temperature gradient from tibial crest to dorsum of foot is within normal bilateral.  NEUROLOGIC EXAMINATION OF THE LOWER LIMBS: All epicritic and tactile sensations grossly intact.Sharp and Dull discriminatory sensations at the plantar ball of hallux is intact bilateral.   MUSCULOSKELETAL EXAMINATION: Chronic back pain. Contracted lesser digits bilateral.  ASSESSMENT: NIDDM. Painful nail left great toe. Hammer toe deformities.  PLAN: As per request both feet measured for diabetic shoes.

## 2017-09-29 ENCOUNTER — Encounter: Payer: Self-pay | Admitting: Podiatry

## 2017-09-29 NOTE — Patient Instructions (Addendum)
Seen for diabetic foot care. Painful nails debrided. Both feet measured for diabetic shoes.

## 2017-10-01 ENCOUNTER — Telehealth: Payer: Self-pay | Admitting: *Deleted

## 2017-10-01 NOTE — Telephone Encounter (Signed)
Faxed authorization request  for Dr. Gibson Ramp shoes to [place]

## 2017-11-09 ENCOUNTER — Encounter: Payer: Self-pay | Admitting: Internal Medicine

## 2017-11-22 ENCOUNTER — Telehealth: Payer: Self-pay | Admitting: *Deleted

## 2017-11-22 NOTE — Telephone Encounter (Signed)
Letter mailed to patient for [place] travel expenses for him to submit to [place]

## 2017-11-23 ENCOUNTER — Telehealth: Payer: Self-pay

## 2017-11-23 NOTE — Telephone Encounter (Signed)
Received a VM from pts spouse asking when it's time for pt to schedule his TCS , will his medication be sent to the [facility] or a local pharmacy. Called spouse back and the prep can be sent to either pharmacy of choice per MB.

## 2017-12-10 DIAGNOSIS — G8929 Other chronic pain: Secondary | ICD-10-CM | POA: Diagnosis not present

## 2017-12-14 ENCOUNTER — Ambulatory Visit: Payer: Medicare Other | Admitting: Internal Medicine

## 2017-12-14 ENCOUNTER — Telehealth: Payer: Self-pay | Admitting: *Deleted

## 2017-12-14 ENCOUNTER — Other Ambulatory Visit: Payer: Self-pay | Admitting: *Deleted

## 2017-12-14 ENCOUNTER — Ambulatory Visit (INDEPENDENT_AMBULATORY_CARE_PROVIDER_SITE_OTHER): Payer: No Typology Code available for payment source | Admitting: Internal Medicine

## 2017-12-14 ENCOUNTER — Encounter: Payer: Self-pay | Admitting: Internal Medicine

## 2017-12-14 ENCOUNTER — Encounter: Payer: Self-pay | Admitting: *Deleted

## 2017-12-14 VITALS — BP 159/96 | HR 101 | Temp 98.8°F | Ht 70.0 in | Wt 218.2 lb

## 2017-12-14 DIAGNOSIS — Z1212 Encounter for screening for malignant neoplasm of rectum: Secondary | ICD-10-CM

## 2017-12-14 DIAGNOSIS — K279 Peptic ulcer, site unspecified, unspecified as acute or chronic, without hemorrhage or perforation: Secondary | ICD-10-CM

## 2017-12-14 DIAGNOSIS — Z1211 Encounter for screening for malignant neoplasm of colon: Secondary | ICD-10-CM | POA: Diagnosis not present

## 2017-12-14 NOTE — Telephone Encounter (Signed)
Pre-op scheduled for 01/10/18 at 1:45pm. Letter mailed. LMOVM.

## 2017-12-14 NOTE — Progress Notes (Signed)
Primary Care Physician:  Alain Marion, MD([facility]) Primary Gastroenterologist:  Dr. Gala Romney  Pre-Procedure History & Physical: HPI:  Richard Simmons is a 72 y.o. male here for follow-up.  Presented with an upper GI bleed secondary to peptic ulcer disease requiring therapeutic intervention last year.  Ulcer disease likely secondary to aspirin powders.  H. pylori serologies were negative.  Follow-up EGD demonstrated complete healing of the ulcers.  He has done well clinically -  taking Protonix 40 mg daily.  No overt GI tract symptoms.  He is been sent back to [place] by the [facility] for a colonoscopy.  He is never had a colonoscopy.  No rectal bleeding.  No family history of colon cancer.  Past Medical History:  Diagnosis Date  . Anemia   . Chronic back pain   . Diabetes mellitus without complication ([place])   . Hearing loss   . Neuropathy     Past Surgical History:  Procedure Laterality Date  . ESOPHAGOGASTRODUODENOSCOPY (EGD) WITH PROPOFOL N/A 09/12/2016   Procedure: ESOPHAGOGASTRODUODENOSCOPY (EGD) WITH PROPOFOL;  Surgeon: Rogene Houston, MD;  [facility];  Service: Endoscopy;  Laterality: N/A;  . ESOPHAGOGASTRODUODENOSCOPY (EGD) WITH PROPOFOL N/A 11/30/2016   Procedure: ESOPHAGOGASTRODUODENOSCOPY (EGD) WITH PROPOFOL;  Surgeon: Daneil Dolin, MD;  Location: [facility];  Service: Gastroenterology;  Laterality: N/A;    Prior to Admission medications   Medication Sig Start Date End Date Taking? Authorizing Provider  amitriptyline (ELAVIL) 25 MG tablet Take 75 mg by mouth at bedtime.    Yes [provider]  citalopram (CELEXA) 40 MG tablet Take 20 mg by mouth daily.    Yes [provider]  clonazePAM (KLONOPIN) 0.5 MG tablet Take 0.5 mg by mouth at bedtime.    Yes [provider]  codeine 30 MG tablet Take 30 mg by mouth every 8 (eight) hours as needed for moderate pain.    Yes [provider]  gabapentin (NEURONTIN) 400 MG capsule Take  400 mg by mouth 2 (two) times daily.    Yes [provider]  metFORMIN (GLUCOPHAGE) 850 MG tablet Take 850 mg by mouth 2 (two) times daily with a meal.   Yes [provider]  pantoprazole (PROTONIX) 40 MG tablet Take 1 Tablet by mouth 2 times a day before meals 12/22/16  Yes Annitta Needs, NP    Allergies as of 12/14/2017  . (No Known Allergies)    Family History  Problem Relation Age of Onset  . Colon cancer Neg Hx   . Gastric cancer Neg Hx   . Esophageal cancer Neg Hx     Social History   Socioeconomic History  . Marital status: Married    Spouse name: Not on file  . Number of children: Not on file  . Years of education: Not on file  . Highest education level: Not on file  Occupational History  . Not on file  Social Needs  . Financial resource strain: Not on file  . Food insecurity:    Worry: Not on file    Inability: Not on file  . Transportation needs:    Medical: Not on file    Non-medical: Not on file  Tobacco Use  . Smoking status: Never Smoker  . Smokeless tobacco: Current User    Types: Chew  . Tobacco comment: occasional chew  Substance and Sexual Activity  . Alcohol use: No  . Drug use: No  . Sexual activity: Not on file  Lifestyle  .  Physical activity:    Days per week: Not on file    Minutes per session: Not on file  . Stress: Not on file  Relationships  . Social connections:    Talks on phone: Not on file    Gets together: Not on file    Attends religious service: Not on file    Active member of club or organization: Not on file    Attends meetings of clubs or organizations: Not on file    Relationship status: Not on file  . Intimate partner violence:    Fear of current or ex partner: Not on file    Emotionally abused: Not on file    Physically abused: Not on file    Forced sexual activity: Not on file  Other Topics Concern  . Not on file  Social History Narrative  . Not on file    Review of Systems: See HPI,  otherwise negative ROS  Physical Exam: BP (!) 159/96   Pulse (!) 101   Temp 98.8 F (37.1 C) (Oral)   Ht 5\' 10"  (1.778 m)   Wt 218 lb 3.2 oz (99 kg)   BMI 31.31 kg/m  General:   Alert,   pleasant and cooperative in NAD Neck:  Supple; no masses or thyromegaly. No significant cervical adenopathy. Lungs:  Clear throughout to auscultation.   No wheezes, crackles, or rhonchi. No acute distress. Heart:  Regular rate and rhythm; no murmurs, clicks, rubs,  or gallops. Abdomen: Non-distended, normal bowel sounds.  Soft and nontender without appreciable mass or hepatosplenomegaly.  Pulses:  Normal pulses noted. Extremities:  Without clubbing or edema.  Impression/Plan: Very pleasant 72 year old [employer] veteran presented last year with an significant upper GI bleed requiring therapeutic intervention.  Likely secondary to aspirin powders.  H. pylori serologies negative.  Follow-up EGD verified healing.  Since he has no upper GI tract symptoms currently, no need to repeat an EGD.  He is 72 years old and has never had a colonoscopy.  Recommendations: I have offered this nice gentleman his first ever average her screening colonoscopy.  We will utilize Propofol.The risks, benefits, limitations, alternatives and imponderables have been reviewed with the patient. Questions have been answered. All parties are agreeable.      Notice: This dictation was prepared with Dragon dictation along with smaller phrase technology. Any transcriptional errors that result from this process are unintentional and may not be corrected upon review.

## 2017-12-14 NOTE — Patient Instructions (Addendum)
Schedule a screening colonoscopy - propofol  Decrease Protonix to 40 mg once daily  Continue to avoid aspirin powders / NSAIDS like Advil, Aleve, etc.  Decrease metformin to once in the morning the day before the procedure and hold the medication until after your colonoscopy is performed  Further recommendations to follow

## 2017-12-14 NOTE — Telephone Encounter (Signed)
Patient aware of pre-op.

## 2018-01-04 NOTE — Patient Instructions (Signed)
Richard Simmons  01/04/2018     @PREFPERIOPPHARMACY @   Your procedure is scheduled on  01/17/2018 .  Report to [facility] at  745   A.M.  Call this number if you have problems the morning of surgery:  [phone]   Remember:                   Take these medicines the morning of surgery with A SIP OF WATER  Celexa, codeine, gabapentin, protonix.    Do not wear jewelry, make-up or nail polish.  Do not wear lotions, powders, or perfumes, or deodorant.  Do not shave 48 hours prior to surgery.  Men may shave face and neck.  Do not bring valuables to the hospital.  [facility] is not responsible for any belongings or valuables.  Contacts, dentures or bridgework may not be worn into surgery.  Leave your suitcase in the car.  After surgery it may be brought to your room.  For patients admitted to the hospital, discharge time will be determined by your treatment team.  Patients discharged the day of surgery will not be allowed to drive home.   Name and phone number of your driver:   family Special instructions:  DO NOT take any medications for diabetes the morning of your procedure.  Please read over the following fact sheets that you were given. Anesthesia Post-op Instructions and Care and Recovery After Surgery       Colonoscopy, Adult A colonoscopy is an exam to look at the large intestine. It is done to check for problems, such as:  Lumps (tumors).  Growths (polyps).  Swelling (inflammation).  Bleeding.  What happens before the procedure? Eating and drinking Follow instructions from your doctor about eating and drinking. These instructions may include:  A few days before the procedure - follow a low-fiber diet. ? Avoid nuts. ? Avoid seeds. ? Avoid dried fruit. ? Avoid raw fruits. ? Avoid vegetables.  1-3 days before the procedure - follow a clear liquid diet. Avoid liquids that have red or purple dye. Drink only clear liquids, such as: ? Clear  broth or bouillon. ? Black coffee or tea. ? Clear juice. ? Clear soft drinks or sports drinks. ? Gelatin dessert. ? Popsicles.  On the day of the procedure - do not eat or drink anything during the 2 hours before the procedure.  Bowel prep If you were prescribed an oral bowel prep:  Take it as told by your doctor. Starting the day before your procedure, you will need to drink a lot of liquid. The liquid will cause you to poop (have bowel movements) until your poop is almost clear or light green.  If your skin or butt gets irritated from diarrhea, you may: ? Wipe the area with wipes that have medicine in them, such as adult wet wipes with aloe and vitamin E. ? Put something on your skin that soothes the area, such as petroleum jelly.  If you throw up (vomit) while drinking the bowel prep, take a break for up to 60 minutes. Then begin the bowel prep again. If you keep throwing up and you cannot take the bowel prep without throwing up, call your doctor.  General instructions  Ask your doctor about changing or stopping your normal medicines. This is important if you take diabetes medicines or blood thinners.  Plan to have someone take you home from the hospital or clinic. What happens  during the procedure?  An IV tube may be put into one of your veins.  You will be given medicine to help you relax (sedative).  To reduce your risk of infection: ? Your doctors will wash their hands. ? Your anal area will be washed with soap.  You will be asked to lie on your side with your knees bent.  Your doctor will get a long, thin, flexible tube ready. The tube will have a camera and a light on the end.  The tube will be put into your anus.  The tube will be gently put into your large intestine.  Air will be delivered into your large intestine to keep it open. You may feel some pressure or cramping.  The camera will be used to take photos.  A small tissue sample may be removed from your  body to be looked at under a microscope (biopsy). If any possible problems are found, the tissue will be sent to a lab for testing.  If small growths are found, your doctor may remove them and have them checked for cancer.  The tube that was put into your anus will be slowly removed. The procedure may vary among doctors and hospitals. What happens after the procedure?  Your doctor will check on you often until the medicines you were given have worn off.  Do not drive for 24 hours after the procedure.  You may have a small amount of blood in your poop.  You may pass gas.  You may have mild cramps or bloating in your belly (abdomen).  It is up to you to get the results of your procedure. Ask your doctor, or the department performing the procedure, when your results will be ready. This information is not intended to replace advice given to you by your health care provider. Make sure you discuss any questions you have with your health care provider. Document Released: 03/14/2010 Document Revised: 12/11/2015 Document Reviewed: 04/23/2015 Elsevier Interactive Patient Education  2017 [employer]  Colonoscopy, Adult, Care After This sheet gives you information about how to care for yourself after your procedure. Your health care provider may also give you more specific instructions. If you have problems or questions, contact your health care provider. What can I expect after the procedure? After the procedure, it is common to have:  A small amount of blood in your stool for 24 hours after the procedure.  Some gas.  Mild abdominal cramping or bloating.  Follow these instructions at home: General instructions   For the first 24 hours after the procedure: ? Do not drive or use machinery. ? Do not sign important documents. ? Do not drink alcohol. ? Do your regular daily activities at a slower pace than normal. ? Eat soft, easy-to-digest foods. ? Rest often.  Take over-the-counter  or prescription medicines only as told by your health care provider.  It is up to you to get the results of your procedure. Ask your health care provider, or the department performing the procedure, when your results will be ready. Relieving cramping and bloating  Try walking around when you have cramps or feel bloated.  Apply heat to your abdomen as told by your health care provider. Use a heat source that your health care provider recommends, such as a moist heat pack or a heating pad. ? Place a towel between your skin and the heat source. ? Leave the heat on for 20-30 minutes. ? Remove the heat if your skin turns  bright red. This is especially important if you are unable to feel pain, heat, or cold. You may have a greater risk of getting burned. Eating and drinking  Drink enough fluid to keep your urine clear or pale yellow.  Resume your normal diet as instructed by your health care provider. Avoid heavy or fried foods that are hard to digest.  Avoid drinking alcohol for as long as instructed by your health care provider. Contact a health care provider if:  You have blood in your stool 2-3 days after the procedure. Get help right away if:  You have more than a small spotting of blood in your stool.  You pass large blood clots in your stool.  Your abdomen is swollen.  You have nausea or vomiting.  You have a fever.  You have increasing abdominal pain that is not relieved with medicine. This information is not intended to replace advice given to you by your health care provider. Make sure you discuss any questions you have with your health care provider. Document Released: 09/24/2003 Document Revised: 11/04/2015 Document Reviewed: 04/23/2015 Elsevier Interactive Patient Education  2018 [place] Anesthesia is a term that refers to techniques, procedures, and medicines that help a person stay safe and comfortable during a medical procedure.  Monitored anesthesia care, or sedation, is one type of anesthesia. Your anesthesia specialist may recommend sedation if you will be having a procedure that does not require you to be unconscious, such as:  Cataract surgery.  A dental procedure.  A biopsy.  A colonoscopy.  During the procedure, you may receive a medicine to help you relax (sedative). There are three levels of sedation:  Mild sedation. At this level, you may feel awake and relaxed. You will be able to follow directions.  Moderate sedation. At this level, you will be sleepy. You may not remember the procedure.  Deep sedation. At this level, you will be asleep. You will not remember the procedure.  The more medicine you are given, the deeper your level of sedation will be. Depending on how you respond to the procedure, the anesthesia specialist may change your level of sedation or the type of anesthesia to fit your needs. An anesthesia specialist will monitor you closely during the procedure. Let your health care provider know about:  Any allergies you have.  All medicines you are taking, including vitamins, herbs, eye drops, creams, and over-the-counter medicines.  Any use of steroids (by mouth or as a cream).  Any problems you or family members have had with sedatives and anesthetic medicines.  Any blood disorders you have.  Any surgeries you have had.  Any medical conditions you have, such as sleep apnea.  Whether you are pregnant or may be pregnant.  Any use of cigarettes, alcohol, or street drugs. What are the risks? Generally, this is a safe procedure. However, problems may occur, including:  Getting too much medicine (oversedation).  Nausea.  Allergic reaction to medicines.  Trouble breathing. If this happens, a breathing tube may be used to help with breathing. It will be removed when you are awake and breathing on your own.  Heart trouble.  Lung trouble.  Before the procedure Staying  hydrated Follow instructions from your health care provider about hydration, which may include:  Up to 2 hours before the procedure - you may continue to drink clear liquids, such as water, clear fruit juice, black coffee, and plain tea.  Eating and drinking restrictions Follow instructions from  your health care provider about eating and drinking, which may include:  8 hours before the procedure - stop eating heavy meals or foods such as meat, fried foods, or fatty foods.  6 hours before the procedure - stop eating light meals or foods, such as toast or cereal.  6 hours before the procedure - stop drinking milk or drinks that contain milk.  2 hours before the procedure - stop drinking clear liquids.  Medicines Ask your health care provider about:  Changing or stopping your regular medicines. This is especially important if you are taking diabetes medicines or blood thinners.  Taking medicines such as aspirin and ibuprofen. These medicines can thin your blood. Do not take these medicines before your procedure if your health care provider instructs you not to.  Tests and exams  You will have a physical exam.  You may have blood tests done to show: ? How well your kidneys and liver are working. ? How well your blood can clot.  General instructions  Plan to have someone take you home from the hospital or clinic.  If you will be going home right after the procedure, plan to have someone with you for 24 hours.  What happens during the procedure?  Your blood pressure, heart rate, breathing, level of pain and overall condition will be monitored.  An IV tube will be inserted into one of your veins.  Your anesthesia specialist will give you medicines as needed to keep you comfortable during the procedure. This may mean changing the level of sedation.  The procedure will be performed. After the procedure  Your blood pressure, heart rate, breathing rate, and blood oxygen level  will be monitored until the medicines you were given have worn off.  Do not drive for 24 hours if you received a sedative.  You may: ? Feel sleepy, clumsy, or nauseous. ? Feel forgetful about what happened after the procedure. ? Have a sore throat if you had a breathing tube during the procedure. ? Vomit. This information is not intended to replace advice given to you by your health care provider. Make sure you discuss any questions you have with your health care provider. Document Released: 11/05/2004 Document Revised: 07/19/2015 Document Reviewed: 06/02/2015 Elsevier Interactive Patient Education  2018 [place], Care After These instructions provide you with information about caring for yourself after your procedure. Your health care provider may also give you more specific instructions. Your treatment has been planned according to current medical practices, but problems sometimes occur. Call your health care provider if you have any problems or questions after your procedure. What can I expect after the procedure? After your procedure, it is common to:  Feel sleepy for several hours.  Feel clumsy and have poor balance for several hours.  Feel forgetful about what happened after the procedure.  Have poor judgment for several hours.  Feel nauseous or vomit.  Have a sore throat if you had a breathing tube during the procedure.  Follow these instructions at home: For at least 24 hours after the procedure:   Do not: ? Participate in activities in which you could fall or become injured. ? Drive. ? Use heavy machinery. ? Drink alcohol. ? Take sleeping pills or medicines that cause drowsiness. ? Make important decisions or sign legal documents. ? Take care of children on your own.  Rest. Eating and drinking  Follow the diet that is recommended by your health care provider.  If you vomit,  drink water, juice, or soup when you can drink without  vomiting.  Make sure you have little or no nausea before eating solid foods. General instructions  Have a responsible adult stay with you until you are awake and alert.  Take over-the-counter and prescription medicines only as told by your health care provider.  If you smoke, do not smoke without supervision.  Keep all follow-up visits as told by your health care provider. This is important. Contact a health care provider if:  You keep feeling nauseous or you keep vomiting.  You feel light-headed.  You develop a rash.  You have a fever. Get help right away if:  You have trouble breathing. This information is not intended to replace advice given to you by your health care provider. Make sure you discuss any questions you have with your health care provider. Document Released: 06/02/2015 Document Revised: 10/02/2015 Document Reviewed: 06/02/2015 Elsevier Interactive Patient Education  [employer].

## 2018-01-10 ENCOUNTER — Other Ambulatory Visit: Payer: Self-pay

## 2018-01-10 ENCOUNTER — Encounter (HOSPITAL_COMMUNITY): Payer: Self-pay

## 2018-01-10 ENCOUNTER — Encounter (HOSPITAL_COMMUNITY)
Admission: RE | Admit: 2018-01-10 | Discharge: 2018-01-10 | Disposition: A | Discharge status: Home / Self Care | Payer: Non-veteran care | Source: Ambulatory Visit | Attending: Internal Medicine | Admitting: Internal Medicine

## 2018-01-10 DIAGNOSIS — Z01812 Encounter for preprocedural laboratory examination: Secondary | ICD-10-CM | POA: Insufficient documentation

## 2018-01-10 HISTORY — DX: Essential (primary) hypertension: I10

## 2018-01-10 LAB — CBC WITH DIFFERENTIAL/PLATELET
Abs Immature Granulocytes: 0.06 10*3/uL (ref 0.00–0.07)
BASOS ABS: 0.1 10*3/uL (ref 0.0–0.1)
Basophils Relative: 1 %
EOS PCT: 3 %
Eosinophils Absolute: 0.3 10*3/uL (ref 0.0–0.5)
HCT: 48.8 % (ref 39.0–52.0)
HEMOGLOBIN: 15.5 g/dL (ref 13.0–17.0)
Immature Granulocytes: 1 %
LYMPHS ABS: 2.1 10*3/uL (ref 0.7–4.0)
LYMPHS PCT: 22 %
MCH: 29.6 pg (ref 26.0–34.0)
MCHC: 31.8 g/dL (ref 30.0–36.0)
MCV: 93.3 fL (ref 80.0–100.0)
MONO ABS: 0.7 10*3/uL (ref 0.1–1.0)
MONOS PCT: 8 %
NEUTROS PCT: 65 %
NRBC: 0 % (ref 0.0–0.2)
Neutro Abs: 6.6 10*3/uL (ref 1.7–7.7)
Platelets: 220 10*3/uL (ref 150–400)
RBC: 5.23 MIL/uL (ref 4.22–5.81)
RDW: 14.6 % (ref 11.5–15.5)
WBC: 9.9 10*3/uL (ref 4.0–10.5)

## 2018-01-10 LAB — BASIC METABOLIC PANEL
ANION GAP: 10 (ref 5–15)
BUN: 12 mg/dL (ref 8–23)
CO2: 24 mmol/L (ref 22–32)
CREATININE: 0.75 mg/dL (ref 0.61–1.24)
Calcium: 9.1 mg/dL (ref 8.9–10.3)
Chloride: 101 mmol/L (ref 98–111)
GFR calc non Af Amer: >60 mL/min (ref >60)
GLUCOSE: 190 mg/dL — AB (ref 70–99)
Potassium: 4.2 mmol/L (ref 3.5–5.1)
Sodium: 135 mmol/L (ref 135–145)

## 2018-01-17 ENCOUNTER — Encounter (HOSPITAL_COMMUNITY)
Admission: RE | Disposition: A | Discharge status: Home / Self Care | Payer: Self-pay | Source: Ambulatory Visit | Attending: Internal Medicine

## 2018-01-17 ENCOUNTER — Ambulatory Visit (HOSPITAL_COMMUNITY): Payer: No Typology Code available for payment source | Admitting: Anesthesiology

## 2018-01-17 ENCOUNTER — Encounter (HOSPITAL_COMMUNITY): Payer: Self-pay | Admitting: *Deleted

## 2018-01-17 ENCOUNTER — Ambulatory Visit (HOSPITAL_COMMUNITY)
Admission: RE | Admit: 2018-01-17 | Discharge: 2018-01-17 | Disposition: A | Discharge status: Home / Self Care | Payer: No Typology Code available for payment source | Source: Ambulatory Visit | Attending: Internal Medicine | Admitting: Internal Medicine

## 2018-01-17 DIAGNOSIS — F17220 Nicotine dependence, chewing tobacco, uncomplicated: Secondary | ICD-10-CM | POA: Diagnosis not present

## 2018-01-17 DIAGNOSIS — Z79899 Other long term (current) drug therapy: Secondary | ICD-10-CM | POA: Diagnosis not present

## 2018-01-17 DIAGNOSIS — E1140 Type 2 diabetes mellitus with diabetic neuropathy, unspecified: Secondary | ICD-10-CM | POA: Insufficient documentation

## 2018-01-17 DIAGNOSIS — Z1211 Encounter for screening for malignant neoplasm of colon: Secondary | ICD-10-CM | POA: Diagnosis present

## 2018-01-17 DIAGNOSIS — K5730 Diverticulosis of large intestine without perforation or abscess without bleeding: Secondary | ICD-10-CM

## 2018-01-17 DIAGNOSIS — I10 Essential (primary) hypertension: Secondary | ICD-10-CM | POA: Diagnosis not present

## 2018-01-17 DIAGNOSIS — Z7984 Long term (current) use of oral hypoglycemic drugs: Secondary | ICD-10-CM | POA: Insufficient documentation

## 2018-01-17 DIAGNOSIS — D120 Benign neoplasm of cecum: Secondary | ICD-10-CM

## 2018-01-17 HISTORY — PX: COLONOSCOPY WITH PROPOFOL: SHX5780

## 2018-01-17 HISTORY — PX: POLYPECTOMY: SHX5525

## 2018-01-17 LAB — GLUCOSE, CAPILLARY
Glucose-Capillary: 150 mg/dL — ABNORMAL HIGH (ref 70–99)
Glucose-Capillary: 164 mg/dL — ABNORMAL HIGH (ref 70–99)

## 2018-01-17 SURGERY — COLONOSCOPY WITH PROPOFOL
Anesthesia: Monitor Anesthesia Care

## 2018-01-17 MED ORDER — PROPOFOL 10 MG/ML IV BOLUS
INTRAVENOUS | Status: AC
  Filled 2018-01-17: qty 40

## 2018-01-17 MED ORDER — STERILE WATER FOR IRRIGATION IR SOLN
Status: DC | PRN
  Administered 2018-01-17: 1.5 mL

## 2018-01-17 MED ORDER — PROPOFOL 500 MG/50ML IV EMUL
INTRAVENOUS | Status: DC | PRN
  Administered 2018-01-17: 150 ug/kg/min via INTRAVENOUS
  Administered 2018-01-17: 75 ug/kg/min via INTRAVENOUS

## 2018-01-17 MED ORDER — PROPOFOL 10 MG/ML IV BOLUS
INTRAVENOUS | Status: DC | PRN
  Administered 2018-01-17: 20 mg via INTRAVENOUS
  Administered 2018-01-17: 40 mg via INTRAVENOUS
  Administered 2018-01-17: 20 mg via INTRAVENOUS

## 2018-01-17 MED ORDER — LACTATED RINGERS IV SOLN
INTRAVENOUS | Status: DC
  Administered 2018-01-17: 1000 mL via INTRAVENOUS

## 2018-01-17 NOTE — Transfer of Care (Signed)
Immediate Anesthesia Transfer of Care Note  Patient: Richard Simmons  Procedure(s) Performed: COLONOSCOPY WITH PROPOFOL (N/A ) POLYPECTOMY  Patient Location: PACU  Anesthesia Type:MAC  Level of Consciousness: awake and patient cooperative  Airway & Oxygen Therapy: Patient Spontanous Breathing and Patient connected to nasal cannula oxygen  Post-op Assessment: Report given to RN and Post -op Vital signs reviewed and stable  Post vital signs: Reviewed and stable  Last Vitals:  Vitals Value Taken Time  BP    Temp    Pulse 75 01/17/2018  9:11 AM  Resp 18 01/17/2018  9:11 AM  SpO2 97 % 01/17/2018  9:11 AM  Vitals shown include unvalidated device data.  Last Pain:  Vitals:   01/17/18 0833  TempSrc: Oral  PainSc: 0-No pain      Patients Stated Pain Goal: 7 (02/16/48 7530)  Complications: No apparent anesthesia complications

## 2018-01-17 NOTE — Addendum Note (Signed)
Addendum  created 01/17/18 0942 by Vista Deck, CRNA   Intraprocedure Event edited

## 2018-01-17 NOTE — Anesthesia Postprocedure Evaluation (Signed)
Anesthesia Post Note  Patient: Richard Simmons  Procedure(s) Performed: COLONOSCOPY WITH PROPOFOL (N/A ) POLYPECTOMY  Patient location during evaluation: PACU Anesthesia Type: MAC Level of consciousness: awake and alert and patient cooperative Pain management: satisfactory to patient Vital Signs Assessment: post-procedure vital signs reviewed and stable Respiratory status: spontaneous breathing Cardiovascular status: stable Postop Assessment: no apparent nausea or vomiting Anesthetic complications: no     Last Vitals:  Vitals:   01/17/18 0913 01/17/18 0915  BP:  (!) 107/56  Pulse:  77  Resp:  15  Temp:    SpO2: 96% 96%    Last Pain:  Vitals:   01/17/18 0910  TempSrc:   PainSc: 0-No pain                 Inger Wiest

## 2018-01-17 NOTE — Anesthesia Procedure Notes (Signed)
Procedure Name: MAC Date/Time: 01/17/2018 8:42 AM Performed by: Vista Deck, CRNA Pre-anesthesia Checklist: Patient identified, Emergency Drugs available, Suction available, Timeout performed and Patient being monitored Patient Re-evaluated:Patient Re-evaluated prior to induction Oxygen Delivery Method: Nasal Cannula

## 2018-01-17 NOTE — Op Note (Signed)
[facility] Patient Name: Richard Simmons Procedure Date: 01/17/2018 8:24 AM MRN: [id number] Date of Birth: 12-03-1945 Attending MD: [name] , MD CSN: [id number] Age: 72 Admit Type: Outpatient Procedure:                Colonoscopy Indications:              Screening for colorectal malignant neoplasm Providers:                [name], MD, Gerome Sam, RN,                            Aram Candela Referring MD:              Medicines:                Propofol per Anesthesia Complications:            No immediate complications. Estimated Blood Loss:     Estimated blood loss was minimal. Procedure:                Pre-Anesthesia Assessment:                           - Prior to the procedure, a History and Physical                            was performed, and patient medications and                            allergies were reviewed. The patient's tolerance of                            previous anesthesia was also reviewed. The risks                            and benefits of the procedure and the sedation                            options and risks were discussed with the patient.                            All questions were answered, and informed consent                            was obtained. Prior Anticoagulants: The patient has                            taken no previous anticoagulant or antiplatelet                            agents. ASA Grade Assessment: II - A patient with                            mild systemic disease. After reviewing the risks  and benefits, the patient was deemed in                            satisfactory condition to undergo the procedure.                           After obtaining informed consent, the colonoscope                            was passed under direct vision. Throughout the                            procedure, the patient's blood pressure, pulse, and   oxygen saturations were monitored continuously. The                            [id number]-[id number] ([id number]) scope was introduced through                            the and advanced to the the cecum, identified by                            appendiceal orifice and ileocecal valve. The                            colonoscopy was performed without difficulty. The                            patient tolerated the procedure well. The quality                            of the bowel preparation was adequate. Scope In: 8:48:48 AM Scope Out: 9:02:14 AM Scope Withdrawal Time: 0 hours 8 minutes 12 seconds  Total Procedure Duration: 0 hours 13 minutes 26 seconds  Findings:      The perianal and digital rectal examinations were normal.      Multiple small and large-mouthed diverticula were found in the entire       colon.      A 4 mm polyp was found in the ileocecal valve. The polyp was sessile.       The polyp was removed with a cold biopsy forceps. Resection and       retrieval were complete. Estimated blood loss was minimal.      The exam was otherwise without abnormality on direct and retroflexion       views. Impression:               - Diverticulosis in the entire examined colon.                           - One 4 mm polyp at the ileocecal valve, removed                            with a cold biopsy forceps. Resected and retrieved.                           -  The examination was otherwise normal on direct                            and retroflexion views. Moderate Sedation:      Moderate (conscious) sedation was personally administered by an       anesthesia professional. The following parameters were monitored: oxygen       saturation, heart rate, blood pressure, respiratory rate, EKG, adequacy       of pulmonary ventilation, and response to care. Recommendation:           - Patient has a contact number available for                            emergencies. The signs and symptoms of potential                             delayed complications were discussed with the                            patient. Return to normal activities tomorrow.                            Written discharge instructions were provided to the                            patient.                           - Resume previous diet.                           - Continue present medications.                           - Repeat colonoscopy date to be determined after                            pending pathology results are reviewed for                            surveillance based on pathology results.                           - Return to GI office (date not yet determined). Procedure Code(s):        --- Professional ---                           [phone], Colonoscopy, flexible; with biopsy, single                            or multiple Diagnosis Code(s):        --- Professional ---                           Z12.11, Encounter for screening for malignant  neoplasm of colon                           D12.0, Benign neoplasm of cecum                           K57.30, Diverticulosis of large intestine without                            perforation or abscess without bleeding CPT copyright 2018 [employer]. All rights reserved. The codes documented in this report are preliminary and upon coder review may  be revised to meet current compliance requirements. [name]. [name], MD [name], MD 01/17/2018 9:07:44 AM This report has been signed electronically. Number of Addenda: 0

## 2018-01-17 NOTE — Discharge Instructions (Addendum)
Colon Polyps Polyps are tissue growths inside the body. Polyps can grow in many places, including the large intestine (colon). A polyp may be a round bump or a mushroom-shaped growth. You could have one polyp or several. Most colon polyps are noncancerous (benign). However, some colon polyps can become cancerous over time. What are the causes? The exact cause of colon polyps is not known. What increases the risk? This condition is more likely to develop in people who:  Have a family history of colon cancer or colon polyps.  Are older than 17 or older than 45 if they are African American.  Have inflammatory bowel disease, such as ulcerative colitis or Crohn disease.  Are overweight.  Smoke cigarettes.  Do not get enough exercise.  Drink too much alcohol.  Eat a diet that is: ? High in fat and red meat. ? Low in fiber.  Had childhood cancer that was treated with abdominal radiation.  What are the signs or symptoms? Most polyps do not cause symptoms. If you have symptoms, they may include:  Blood coming from your rectum when having a bowel movement.  Blood in your stool.The stool may look dark red or black.  A change in bowel habits, such as constipation or diarrhea.  How is this diagnosed? This condition is diagnosed with a colonoscopy. This is a procedure that uses a lighted, flexible scope to look at the inside of your colon. How is this treated? Treatment for this condition involves removing any polyps that are found. Those polyps will then be tested for cancer. If cancer is found, your health care provider will talk to you about options for colon cancer treatment. Follow these instructions at home: Diet  Eat plenty of fiber, such as fruits, vegetables, and whole grains.  Eat foods that are high in calcium and vitamin D, such as milk, cheese, yogurt, eggs, liver, fish, and broccoli.  Limit foods high in fat, red meats, and processed meats, such as hot dogs, sausage,  bacon, and lunch meats.  Maintain a healthy weight, or lose weight if recommended by your health care provider. General instructions  Do not smoke cigarettes.  Do not drink alcohol excessively.  Keep all follow-up visits as told by your health care provider. This is important. This includes keeping regularly scheduled colonoscopies. Talk to your health care provider about when you need a colonoscopy.  Exercise every day or as told by your health care provider. Contact a health care provider if:  You have new or worsening bleeding during a bowel movement.  You have new or increased blood in your stool.  You have a change in bowel habits.  You unexpectedly lose weight. This information is not intended to replace advice given to you by your health care provider. Make sure you discuss any questions you have with your health care provider. Document Released: 11/06/2003 Document Revised: 07/18/2015 Document Reviewed: 12/31/2014 Elsevier Interactive Patient Education  2018 [employer].    Colonoscopy Discharge Instructions  Read the instructions outlined below and refer to this sheet in the next few weeks. These discharge instructions provide you with general information on caring for yourself after you leave the hospital. Your doctor may also give you specific instructions. While your treatment has been planned according to the most current medical practices available, unavoidable complications occasionally occur. If you have any problems or questions after discharge, call Dr. Gala Romney at [phone]. ACTIVITY  You may resume your regular activity, but move at a slower pace for the  next 24 hours.   Take frequent rest periods for the next 24 hours.   Walking will help get rid of the air and reduce the bloated feeling in your belly (abdomen).   No driving for 24 hours (because of the medicine (anesthesia) used during the test).    Do not sign any important legal documents or operate any  machinery for 24 hours (because of the anesthesia used during the test).  NUTRITION  Drink plenty of fluids.   You may resume your normal diet as instructed by your doctor.   Begin with a light meal and progress to your normal diet. Heavy or fried foods are harder to digest and may make you feel sick to your stomach (nauseated).   Avoid alcoholic beverages for 24 hours or as instructed.  MEDICATIONS  You may resume your normal medications unless your doctor tells you otherwise.  WHAT YOU CAN EXPECT TODAY  Some feelings of bloating in the abdomen.   Passage of more gas than usual.   Spotting of blood in your stool or on the toilet paper.  IF YOU HAD POLYPS REMOVED DURING THE COLONOSCOPY:  No aspirin products for 7 days or as instructed.   No alcohol for 7 days or as instructed.   Eat a soft diet for the next 24 hours.  FINDING OUT THE RESULTS OF YOUR TEST Not all test results are available during your visit. If your test results are not back during the visit, make an appointment with your caregiver to find out the results. Do not assume everything is normal if you have not heard from your caregiver or the medical facility. It is important for you to follow up on all of your test results.  SEEK IMMEDIATE MEDICAL ATTENTION IF:  You have more than a spotting of blood in your stool.   Your belly is swollen (abdominal distention).   You are nauseated or vomiting.   You have a temperature over 101.   You have abdominal pain or discomfort that is severe or gets worse throughout the day.    Diverticulosis and polyp information provided  Further recommendations to follow pending review of pathology report   Diverticulosis Diverticulosis is a condition that develops when small pouches (diverticula) form in the wall of the large intestine (colon). The colon is where water is absorbed and stool is formed. The pouches form when the inside layer of the colon pushes through weak spots  in the outer layers of the colon. You may have a few pouches or many of them. What are the causes? The cause of this condition is not known. What increases the risk? The following factors may make you more likely to develop this condition:  Being older than age 51. Your risk for this condition increases with age. Diverticulosis is rare among people younger than age 2. By age 109, many people have it.  Eating a low-fiber diet.  Having frequent constipation.  Being overweight.  Not getting enough exercise.  Smoking.  Taking over-the-counter pain medicines, like aspirin and ibuprofen.  Having a family history of diverticulosis.  What are the signs or symptoms? In most people, there are no symptoms of this condition. If you do have symptoms, they may include:  Bloating.  Cramps in the abdomen.  Constipation or diarrhea.  Pain in the lower left side of the abdomen.  How is this diagnosed? This condition is most often diagnosed during an exam for other colon problems. Because diverticulosis usually has no  symptoms, it often cannot be diagnosed independently. This condition may be diagnosed by:  Using a flexible scope to examine the colon (colonoscopy).  Taking an X-ray of the colon after dye has been put into the colon (barium enema).  Doing a CT scan.  How is this treated? You may not need treatment for this condition if you have never developed an infection related to diverticulosis. If you have had an infection before, treatment may include:  Eating a high-fiber diet. This may include eating more fruits, vegetables, and grains.  Taking a fiber supplement.  Taking a live bacteria supplement (probiotic).  Taking medicine to relax your colon.  Taking antibiotic medicines.  Follow these instructions at home:  Drink 6-8 glasses of water or more each day to prevent constipation.  Try not to strain when you have a bowel movement.  If you have had an infection  before: ? Eat more fiber as directed by your health care provider or your diet and nutrition specialist (dietitian). ? Take a fiber supplement or probiotic, if your health care provider approves.  Take over-the-counter and prescription medicines only as told by your health care provider.  If you were prescribed an antibiotic, take it as told by your health care provider. Do not stop taking the antibiotic even if you start to feel better.  Keep all follow-up visits as told by your health care provider. This is important. Contact a health care provider if:  You have pain in your abdomen.  You have bloating.  You have cramps.  You have not had a bowel movement in 3 days. Get help right away if:  Your pain gets worse.  Your bloating becomes very bad.  You have a fever or chills, and your symptoms suddenly get worse.  You vomit.  You have bowel movements that are bloody or black.  You have bleeding from your rectum. Summary  Diverticulosis is a condition that develops when small pouches (diverticula) form in the wall of the large intestine (colon).  You may have a few pouches or many of them.  This condition is most often diagnosed during an exam for other colon problems.  If you have had an infection related to diverticulosis, treatment may include increasing the fiber in your diet, taking supplements, or taking medicines. This information is not intended to replace advice given to you by your health care provider. Make sure you discuss any questions you have with your health care provider. Document Released: 11/07/2003 Document Revised: 12/30/2015 Document Reviewed: 12/30/2015 Elsevier Interactive Patient Education  2017 [employer].

## 2018-01-17 NOTE — H&P (Signed)
@LOGO @   Primary Care Physician:  Alain Marion, MD Primary Gastroenterologist:  Dr. Gala Romney  Pre-Procedure History & Physical: HPI:  Richard Simmons is a 71 y.o. male is here for a screening colonoscopy.  First ever average risk screening examination.  Past Medical History:  Diagnosis Date  . Anemia   . Chronic back pain   . Diabetes mellitus without complication ([place])   . Hearing loss   . Hypertension   . Neuropathy     Past Surgical History:  Procedure Laterality Date  . ESOPHAGOGASTRODUODENOSCOPY (EGD) WITH PROPOFOL N/A 09/12/2016   Procedure: ESOPHAGOGASTRODUODENOSCOPY (EGD) WITH PROPOFOL;  Surgeon: Rogene Houston, MD;  Location: [facility];  Service: Endoscopy;  Laterality: N/A;  . ESOPHAGOGASTRODUODENOSCOPY (EGD) WITH PROPOFOL N/A 11/30/2016   Procedure: ESOPHAGOGASTRODUODENOSCOPY (EGD) WITH PROPOFOL;  Surgeon: Daneil Dolin, MD;  Location: [facility];  Service: Gastroenterology;  Laterality: N/A;    Prior to Admission medications   Medication Sig Start Date End Date Taking? Authorizing Provider  acetaminophen (TYLENOL) 325 MG tablet Take 325-650 mg by mouth every 6 (six) hours as needed (pain).   Yes [provider]  amitriptyline (ELAVIL) 75 MG tablet Take 75 mg by mouth at bedtime.   Yes [provider]  citalopram (CELEXA) 40 MG tablet Take 40 mg by mouth at bedtime.    Yes [provider]  clonazePAM (KLONOPIN) 0.5 MG tablet Take 0.5 mg by mouth at bedtime.    Yes [provider]  codeine 30 MG tablet Take 30-60 mg by mouth every 8 (eight) hours as needed for moderate pain.    Yes [provider]  ferrous sulfate 325 (65 FE) MG tablet Take 325 mg by mouth daily with breakfast.   Yes [provider]  gabapentin (NEURONTIN) 400 MG capsule Take 400 mg by mouth 3 (three) times daily.    Yes [provider]  LUBRICATING PLUS EYE DROPS 0.5 % SOLN Place 1 drop into both eyes 3 (three) times daily.   Yes  [provider]  metFORMIN (GLUCOPHAGE) 850 MG tablet Take 850 mg by mouth 2 (two) times daily with a meal.   Yes [provider]  pantoprazole (PROTONIX) 40 MG tablet Take 1 Tablet by mouth 2 times a day before meals Patient taking differently: Take 40 mg by mouth daily before breakfast.  12/22/16  Yes Annitta Needs, NP    Allergies as of 12/14/2017  . (No Known Allergies)    Family History  Problem Relation Age of Onset  . Colon cancer Neg Hx   . Gastric cancer Neg Hx   . Esophageal cancer Neg Hx     Social History   Socioeconomic History  . Marital status: Married    Spouse name: Not on file  . Number of children: Not on file  . Years of education: Not on file  . Highest education level: Not on file  Occupational History  . Not on file  Social Needs  . Financial resource strain: Not on file  . Food insecurity:    Worry: Not on file    Inability: Not on file  . Transportation needs:    Medical: Not on file    Non-medical: Not on file  Tobacco Use  . Smoking status: Never Smoker  . Smokeless tobacco: Current User    Types: Chew  . Tobacco comment: occasional chew  Substance and Sexual Activity  . Alcohol use: No  . Drug use: No  . Sexual activity:  Not on file  Lifestyle  . Physical activity:    Days per week: Not on file    Minutes per session: Not on file  . Stress: Not on file  Relationships  . Social connections:    Talks on phone: Not on file    Gets together: Not on file    Attends religious service: Not on file    Active member of club or organization: Not on file    Attends meetings of clubs or organizations: Not on file    Relationship status: Not on file  . Intimate partner violence:    Fear of current or ex partner: Not on file    Emotionally abused: Not on file    Physically abused: Not on file    Forced sexual activity: Not on file  Other Topics Concern  . Not on file  Social History Narrative  . Not on file    Review  of Systems: See HPI, otherwise negative ROS  Physical Exam: BP 137/67   Pulse 85   Temp (!) 97 F (36.1 C) (Oral)   Resp 18   SpO2 95%  General:   Alert,  Well-developed, well-nourished, pleasant and cooperative in NAD Lungs:  Clear throughout to auscultation.   No wheezes, crackles, or rhonchi. No acute distress. Heart:  Regular rate and rhythm; no murmurs, clicks, rubs,  or gallops. Abdomen:  Soft, nontender and nondistended. No masses, hepatosplenomegaly or hernias noted. Normal bowel sounds, without guarding, and without rebound.    Impression/Plan: Richard Simmons is now here to undergo a screening colonoscopy.  First ever average rescreening examination.  Risks, benefits, limitations, imponderables and alternatives regarding colonoscopy have been reviewed with the patient. Questions have been answered. All parties agreeable.     Notice:  This dictation was prepared with Dragon dictation along with smaller phrase technology. Any transcriptional errors that result from this process are unintentional and may not be corrected upon review.

## 2018-01-17 NOTE — Anesthesia Preprocedure Evaluation (Signed)
Anesthesia Evaluation  Patient identified by MRN, date of birth, ID band Patient awake    Reviewed: Allergy & Precautions, H&P , NPO status , Patient's Chart, lab work & pertinent test results, reviewed documented beta blocker date and time   Airway Mallampati: III  TM Distance: >3 FB Neck ROM: full    Dental  (+) Edentulous Lower, Edentulous Upper   Pulmonary neg pulmonary ROS,    Pulmonary exam normal breath sounds clear to auscultation       Cardiovascular Exercise Tolerance: Good hypertension, negative cardio ROS   Rhythm:regular Rate:Normal     Neuro/Psych negative neurological ROS  negative psych ROS   GI/Hepatic Neg liver ROS, PUD,   Endo/Other  negative endocrine ROSdiabetes  Renal/GU negative Renal ROS  negative genitourinary   Musculoskeletal   Abdominal   Peds  Hematology  (+) Blood dyscrasia, anemia ,   Anesthesia Other Findings   Reproductive/Obstetrics negative OB ROS                             Anesthesia Physical Anesthesia Plan  ASA: II  Anesthesia Plan: MAC   Post-op Pain Management:    Induction:   PONV Risk Score and Plan:   Airway Management Planned:   Additional Equipment:   Intra-op Plan:   Post-operative Plan:   Informed Consent: I have reviewed the patients History and Physical, chart, labs and discussed the procedure including the risks, benefits and alternatives for the proposed anesthesia with the patient or authorized representative who has indicated his/her understanding and acceptance.   Dental Advisory Given  Plan Discussed with: CRNA  Anesthesia Plan Comments:         Anesthesia Quick Evaluation

## 2018-01-18 ENCOUNTER — Encounter: Payer: Self-pay | Admitting: Internal Medicine

## 2018-01-24 ENCOUNTER — Encounter (HOSPITAL_COMMUNITY): Payer: Self-pay | Admitting: Internal Medicine

## 2021-02-27 ENCOUNTER — Encounter (HOSPITAL_COMMUNITY): Payer: Self-pay | Admitting: Emergency Medicine

## 2021-02-27 ENCOUNTER — Emergency Department (HOSPITAL_COMMUNITY): Payer: No Typology Code available for payment source

## 2021-02-27 ENCOUNTER — Other Ambulatory Visit: Payer: Self-pay

## 2021-02-27 ENCOUNTER — Emergency Department (HOSPITAL_COMMUNITY)
Admission: EM | Admit: 2021-02-27 | Discharge: 2021-02-27 | Disposition: A | Discharge status: Home / Self Care | Payer: No Typology Code available for payment source | Attending: Emergency Medicine | Admitting: Emergency Medicine

## 2021-02-27 DIAGNOSIS — S0990XA Unspecified injury of head, initial encounter: Secondary | ICD-10-CM | POA: Diagnosis not present

## 2021-02-27 DIAGNOSIS — M25511 Pain in right shoulder: Secondary | ICD-10-CM | POA: Diagnosis present

## 2021-02-27 DIAGNOSIS — Z23 Encounter for immunization: Secondary | ICD-10-CM | POA: Insufficient documentation

## 2021-02-27 DIAGNOSIS — Z7984 Long term (current) use of oral hypoglycemic drugs: Secondary | ICD-10-CM | POA: Insufficient documentation

## 2021-02-27 DIAGNOSIS — W1839XA Other fall on same level, initial encounter: Secondary | ICD-10-CM | POA: Diagnosis not present

## 2021-02-27 DIAGNOSIS — W228XXA Striking against or struck by other objects, initial encounter: Secondary | ICD-10-CM | POA: Diagnosis not present

## 2021-02-27 DIAGNOSIS — W19XXXA Unspecified fall, initial encounter: Secondary | ICD-10-CM

## 2021-02-27 DIAGNOSIS — S0081XA Abrasion of other part of head, initial encounter: Secondary | ICD-10-CM | POA: Insufficient documentation

## 2021-02-27 DIAGNOSIS — I10 Essential (primary) hypertension: Secondary | ICD-10-CM | POA: Diagnosis not present

## 2021-02-27 DIAGNOSIS — E1142 Type 2 diabetes mellitus with diabetic polyneuropathy: Secondary | ICD-10-CM | POA: Insufficient documentation

## 2021-02-27 LAB — BASIC METABOLIC PANEL
Anion gap: 10 (ref 5–15)
BUN: 10 mg/dL (ref 8–23)
CO2: 30 mmol/L (ref 22–32)
Calcium: 8.7 mg/dL — ABNORMAL LOW (ref 8.9–10.3)
Chloride: 97 mmol/L — ABNORMAL LOW (ref 98–111)
Creatinine, Ser: 0.68 mg/dL (ref 0.61–1.24)
GFR, Estimated: >60 mL/min (ref >60)
Glucose, Bld: 142 mg/dL — ABNORMAL HIGH (ref 70–99)
Potassium: 4.3 mmol/L (ref 3.5–5.1)
Sodium: 137 mmol/L (ref 135–145)

## 2021-02-27 LAB — URINALYSIS, ROUTINE W REFLEX MICROSCOPIC
Bilirubin Urine: NEGATIVE
Glucose, UA: NEGATIVE mg/dL
Hgb urine dipstick: NEGATIVE
Ketones, ur: 5 mg/dL — AB
Leukocytes,Ua: NEGATIVE
Nitrite: NEGATIVE
Protein, ur: NEGATIVE mg/dL
Specific Gravity, Urine: 1.029 (ref 1.005–1.030)
pH: 8 (ref 5.0–8.0)

## 2021-02-27 LAB — CBC WITH DIFFERENTIAL/PLATELET
Abs Immature Granulocytes: 0.02 10*3/uL (ref 0.00–0.07)
Basophils Absolute: 0.1 10*3/uL (ref 0.0–0.1)
Basophils Relative: 1 %
Eosinophils Absolute: 0.3 10*3/uL (ref 0.0–0.5)
Eosinophils Relative: 3 %
HCT: 42.1 % (ref 39.0–52.0)
Hemoglobin: 13.9 g/dL (ref 13.0–17.0)
Immature Granulocytes: 0 %
Lymphocytes Relative: 26 %
Lymphs Abs: 2.1 10*3/uL (ref 0.7–4.0)
MCH: 31.6 pg (ref 26.0–34.0)
MCHC: 33 g/dL (ref 30.0–36.0)
MCV: 95.7 fL (ref 80.0–100.0)
Monocytes Absolute: 0.6 10*3/uL (ref 0.1–1.0)
Monocytes Relative: 7 %
Neutro Abs: 5.3 10*3/uL (ref 1.7–7.7)
Neutrophils Relative %: 63 %
Platelets: 226 10*3/uL (ref 150–400)
RBC: 4.4 MIL/uL (ref 4.22–5.81)
RDW: 14.9 % (ref 11.5–15.5)
WBC: 8.3 10*3/uL (ref 4.0–10.5)
nRBC: 0 % (ref 0.0–0.2)

## 2021-02-27 IMAGING — CT CT MAXILLOFACIAL W/O CM
3 series · 15 of 47 positions shown, 18 images · non-contrast
Comparison: None.

CLINICAL DATA: Facial trauma, blunt

Fall striking head.
EXAM:
CT MAXILLOFACIAL WITHOUT CONTRAST
TECHNIQUE: Multidetector CT imaging of the maxillofacial structures was
performed. Multiplanar CT image reconstructions were also generated.

[Series 2: max soft · axial · 0.45mm/px · z∈[-141,+7]mm · 9 of 86 slices shown, 12 images]
[im 6/86  brain]
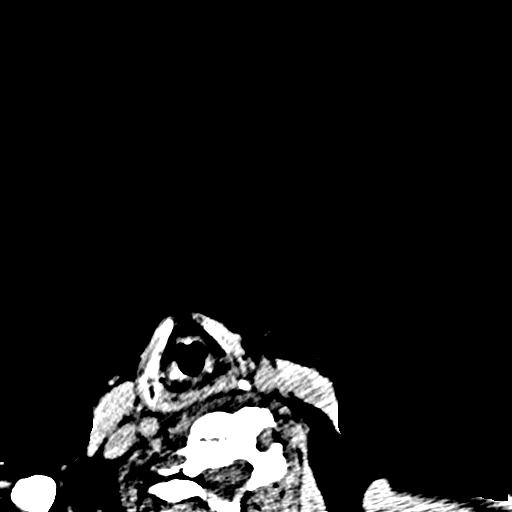
[im 6/86  bone]
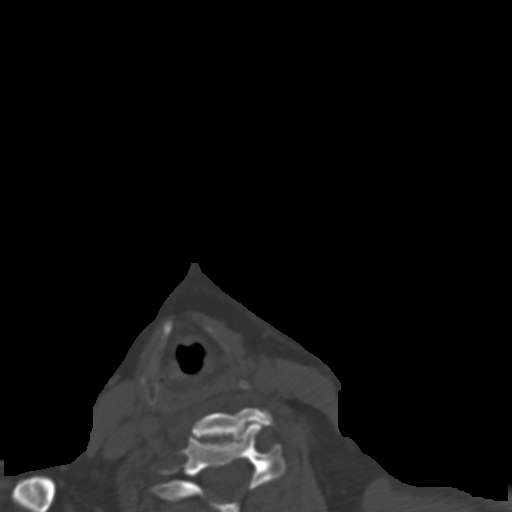
[im 15/86  bone]
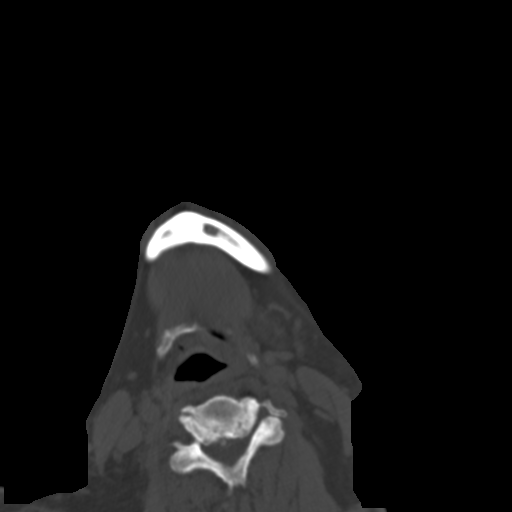
[im 24/86  bone]
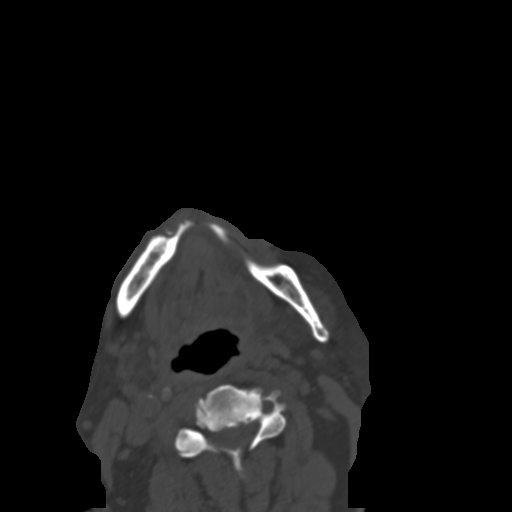
[im 33/86  bone]
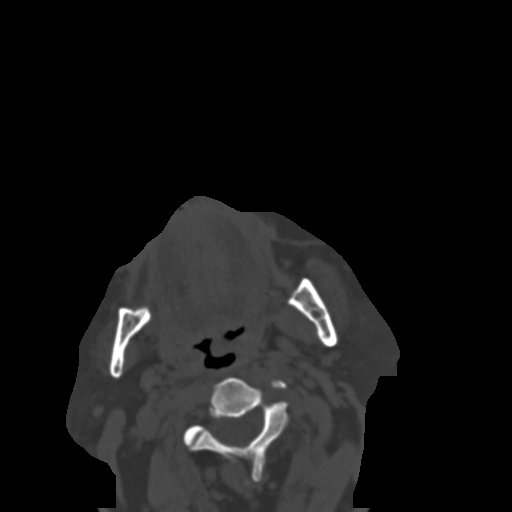
[im 44/86  brain]
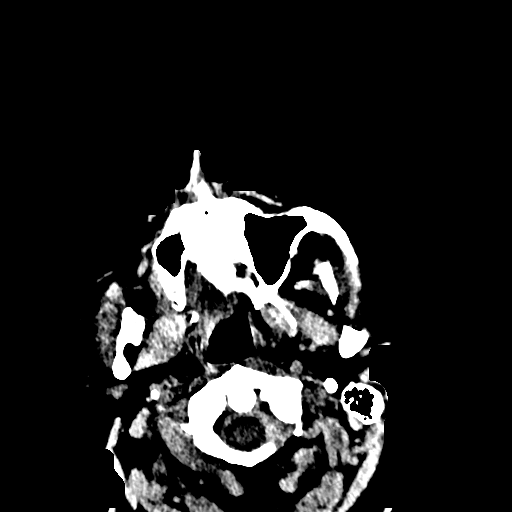
[im 44/86  bone]
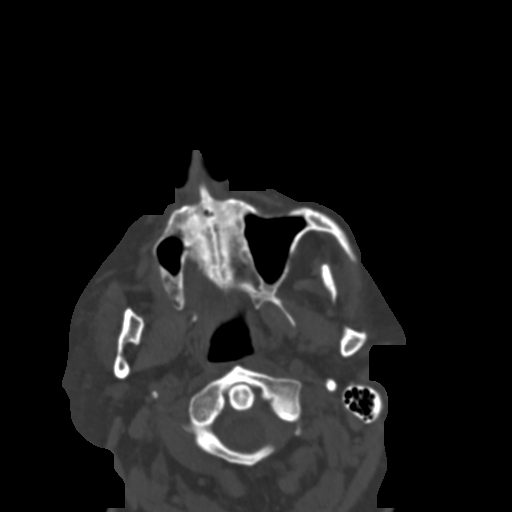
[im 53/86  bone]
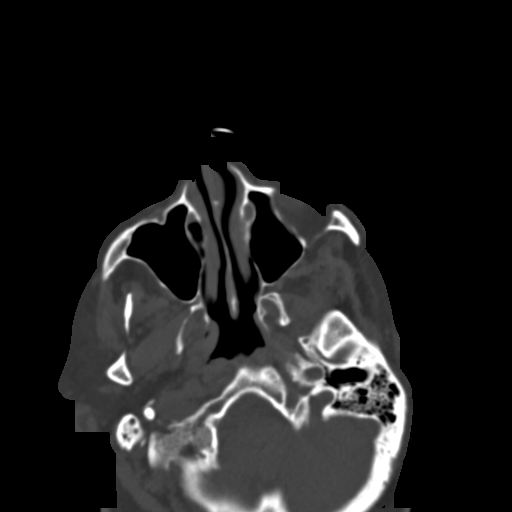
[im 62/86  bone]
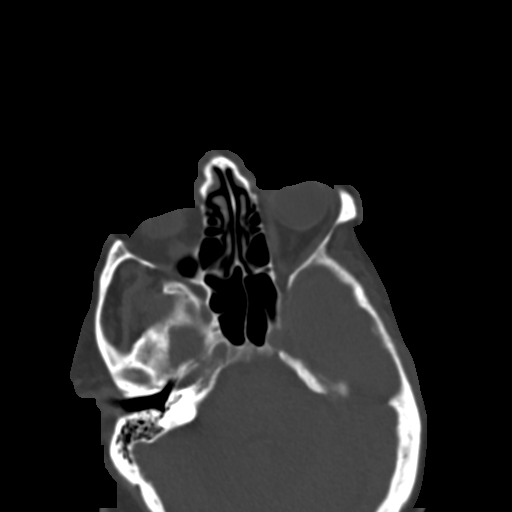
[im 71/86  bone]
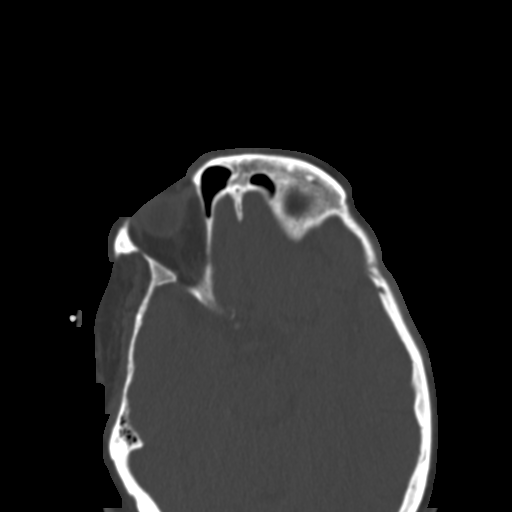
[im 80/86  brain]
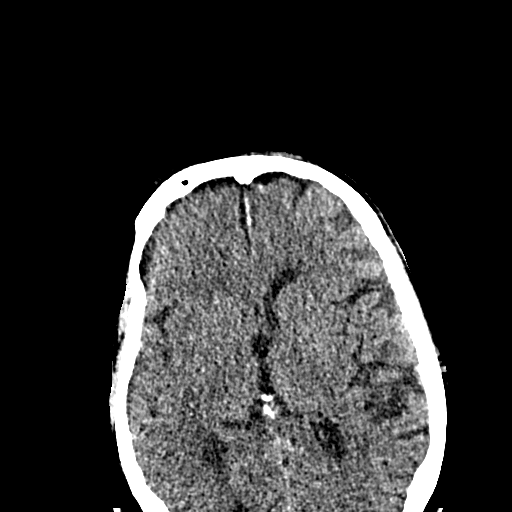
[im 80/86  bone]
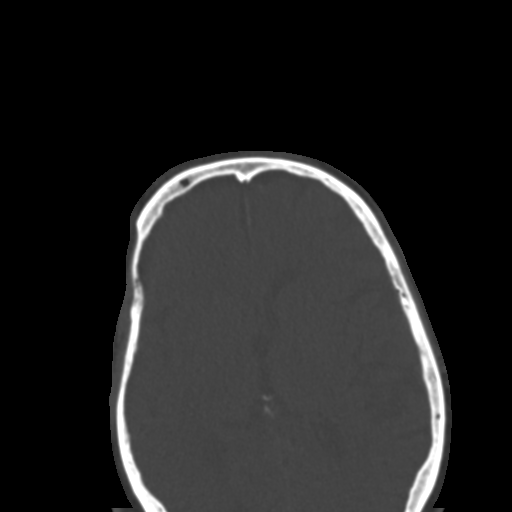

[Series 6: coronal soft · coronal · 0.39mm/px · 3 of 100 slices shown]
[im 45/100  bone]
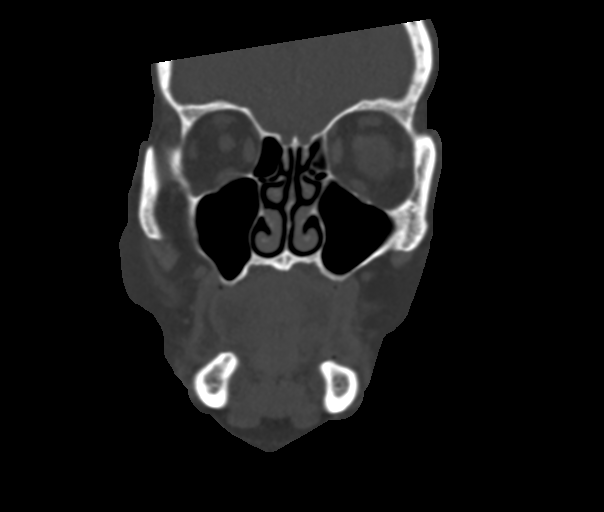
[im 56/100  bone]
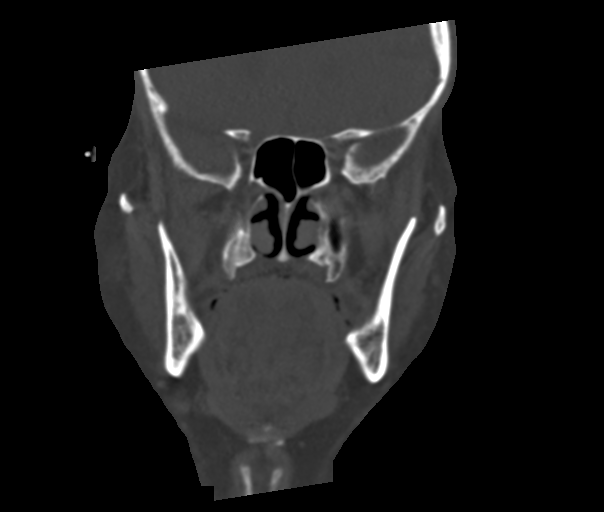
[im 67/100  bone]
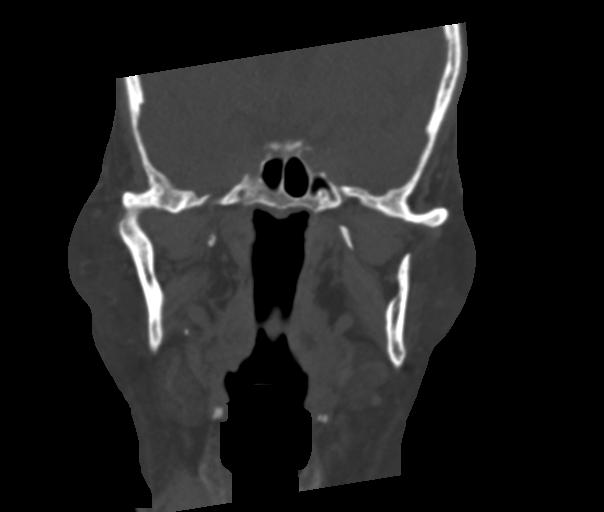

[Series 8: sagittal soft · sagittal · 0.37mm/px · 3 of 110 slices shown]
[im 43/110  bone]
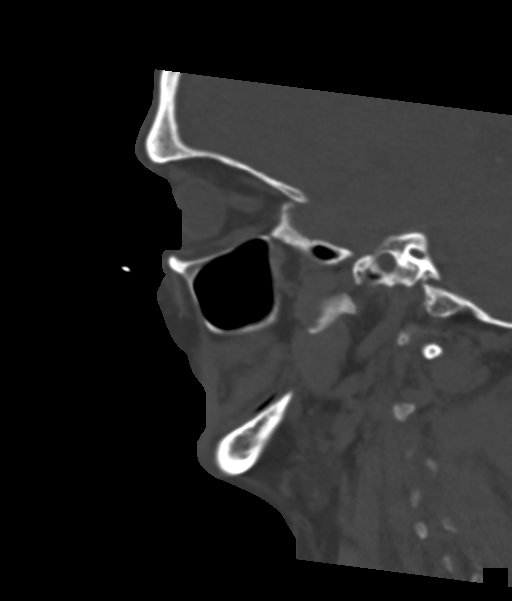
[im 55/110  bone]
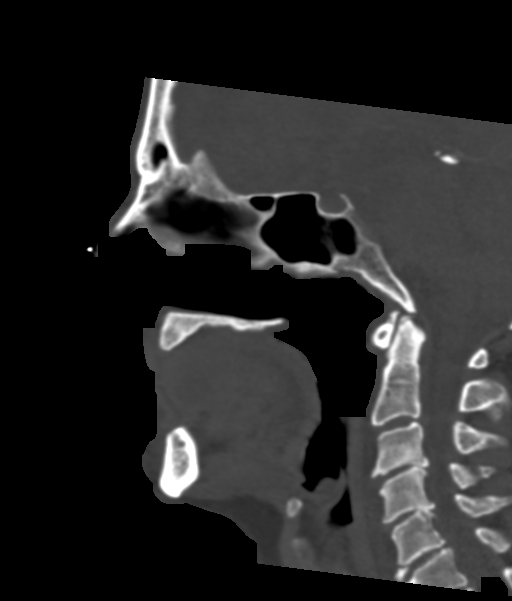
[im 67/110  bone]
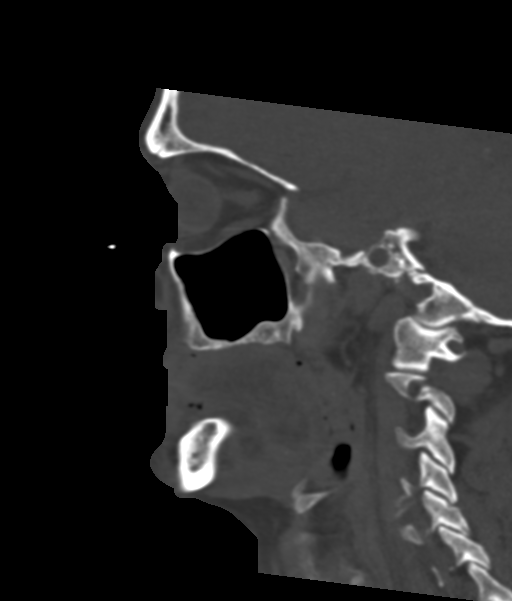

[15 of 47 positions shown; findings below may reference images not displayed]

FINDINGS: Osseous: Cortical irregularity of the right nasal bone is of unknown
acuity, series 3, image 29. No acute fracture of the zygomatic
arches or mandibles. The temporomandibular joints are congruent.
Patient is edentulous. There is rightward nasal septal deviation.

Orbits: No acute orbital fracture.  No globe injury.

Sinuses: No sinus fracture or fluid level. Paranasal sinuses are
clear. No mastoid effusion.

Soft tissues: Left supraorbital scalp hematoma.

Limited intracranial: Assessed on concurrent head CT, reported
separately.
IMPRESSION: 1. Cortical irregularity of the right nasal bone is of unknown
acuity, may be chronic, however recommend correlation for focal
tenderness.
2. No additional acute facial bone fracture. Left supraorbital scalp
hematoma.

## 2021-02-27 IMAGING — CT CT VENOGRAM HEAD
1 of 3 series · 19 of 47 positions shown · IV contrast (omnipaque)
Comparison: Prior MRI and CT from earlier the same day.

CLINICAL DATA: Initial evaluation for possible dural venous sinus
thrombosis.

EXAM:
CT VENOGRAM HEAD
TECHNIQUE: Venographic phase images of the brain were obtained following the
administration of intravenous contrast. Multiplanar reformats and
maximum intensity projections were generated.
CONTRAST:  75mL OMNIPAQUE IOHEXOL 300 MG/ML  SOLN

[Series 2: head venogram · axial · 0.51mm/px · z∈[-73,+69]mm · 19 of 77 slices shown]
[im 3/77  brain]
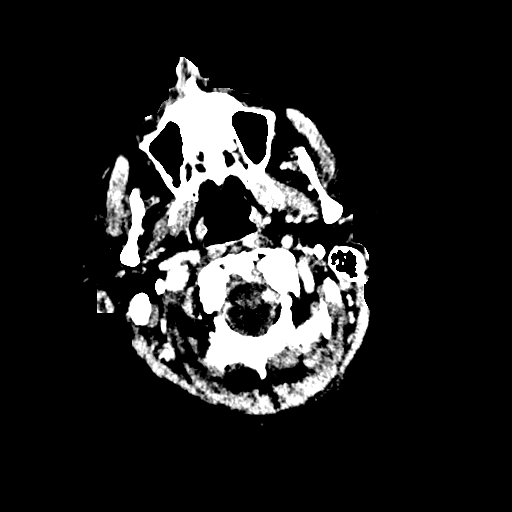
[im 8/77  bone]
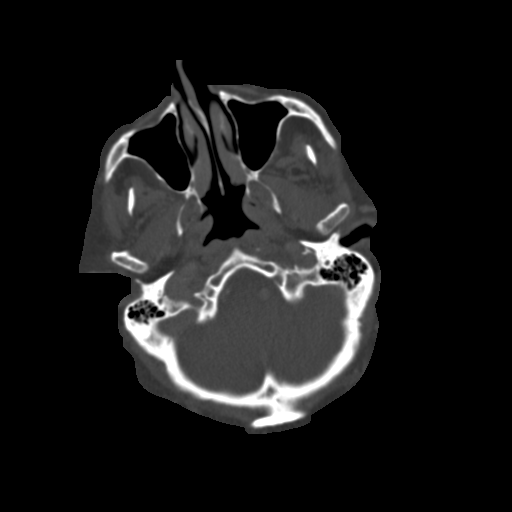
[im 11/77  brain]
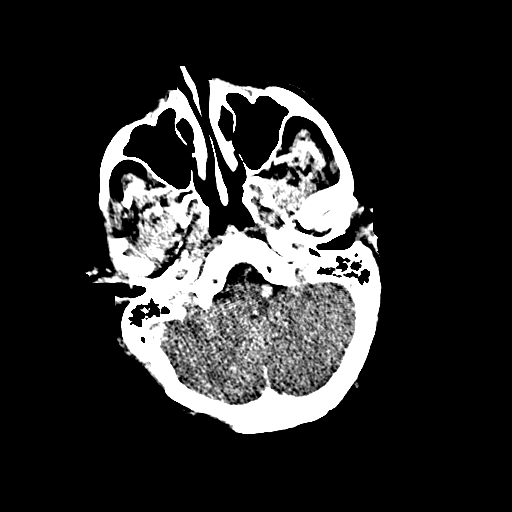
[im 16/77  bone]
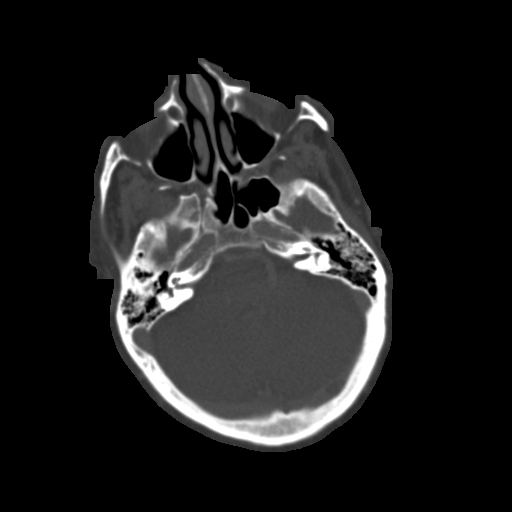
[im 19/77  brain]
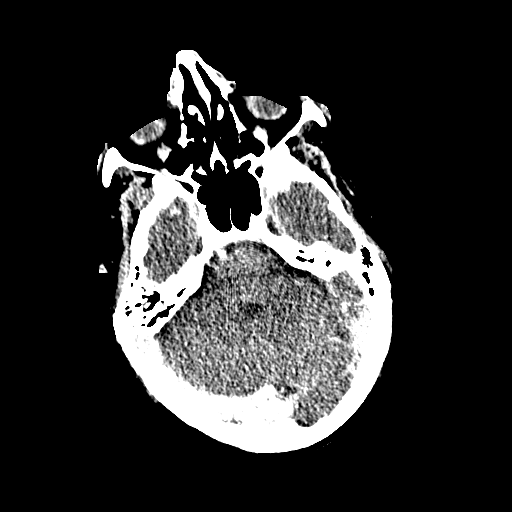
[im 24/77  bone]
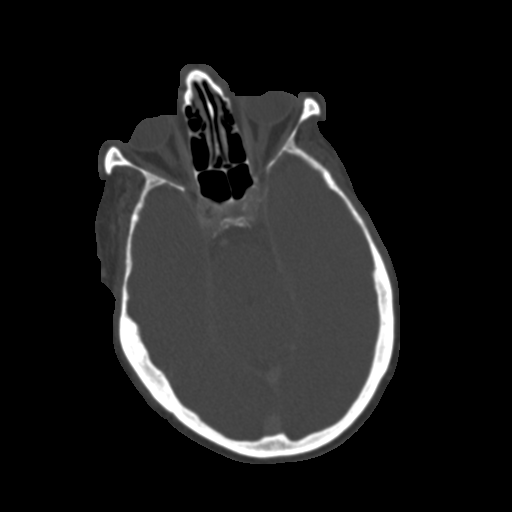
[im 27/77  brain]
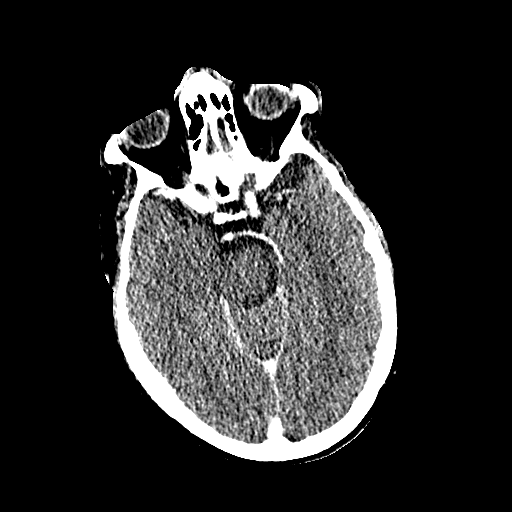
[im 32/77  bone]
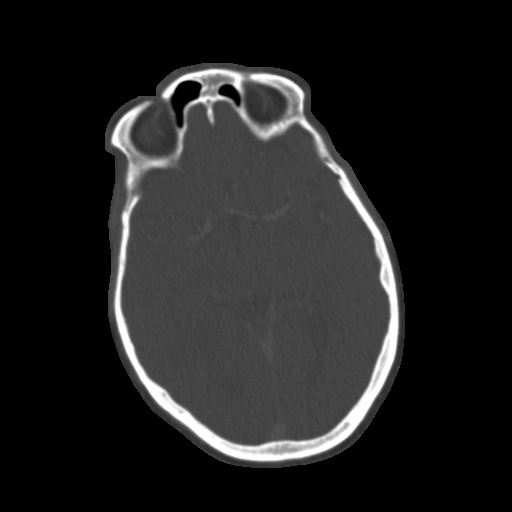
[im 35/77  brain]
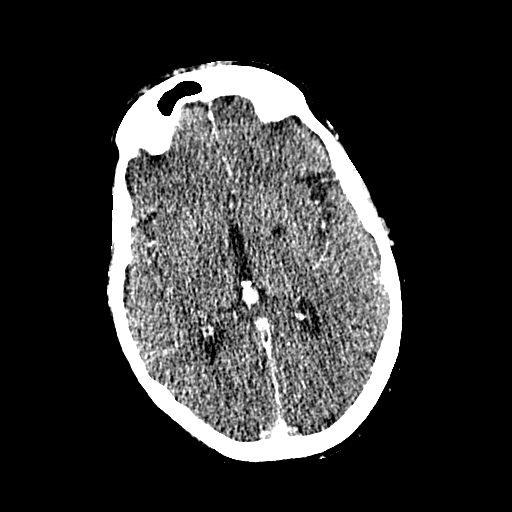
[im 40/77  bone]
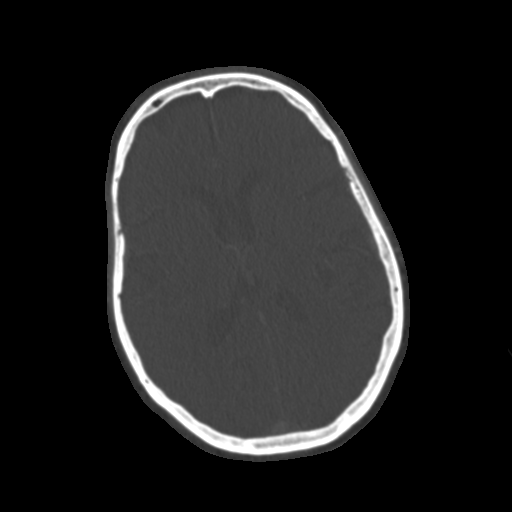
[im 42/77  brain]
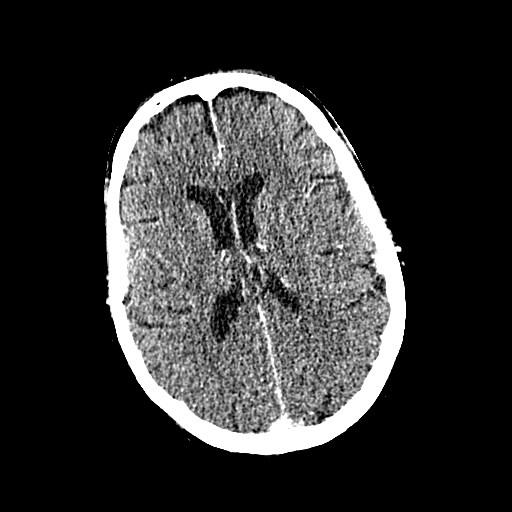
[im 45/77  bone]
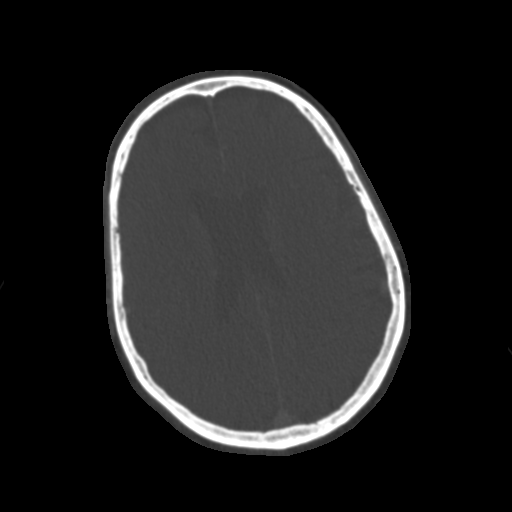
[im 50/77  brain]
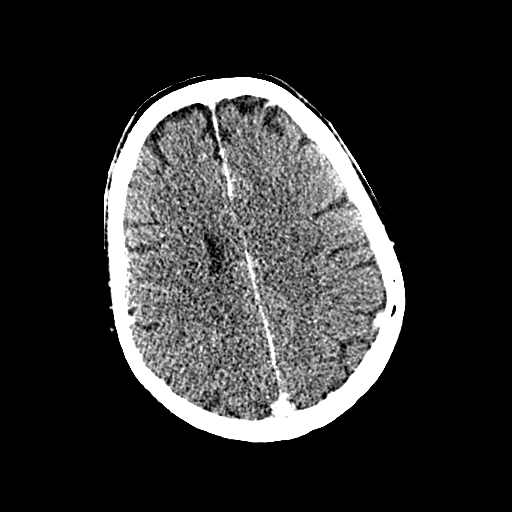
[im 53/77  bone]
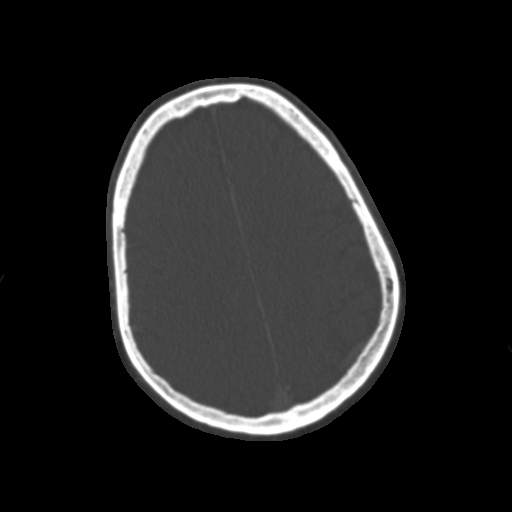
[im 58/77  brain]
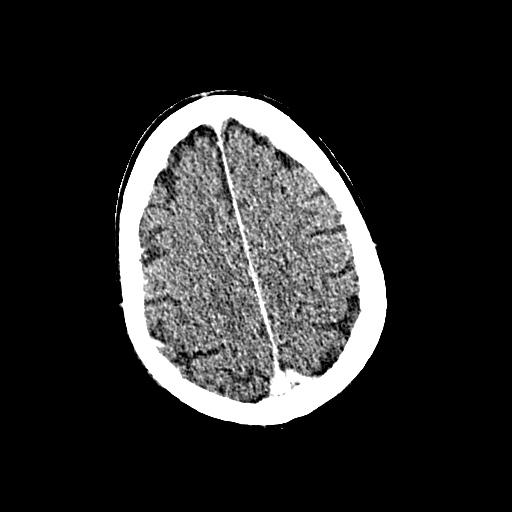
[im 61/77  bone]
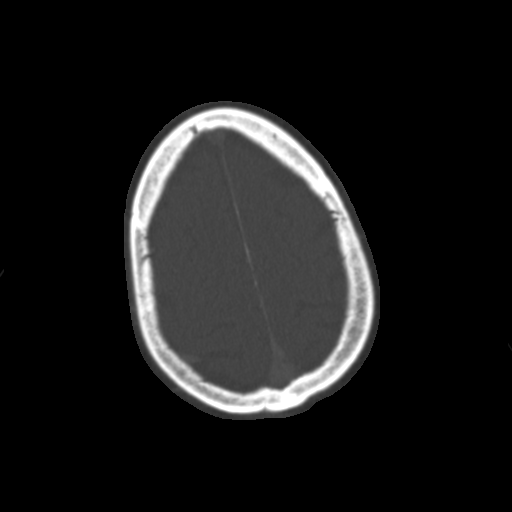
[im 66/77  brain]
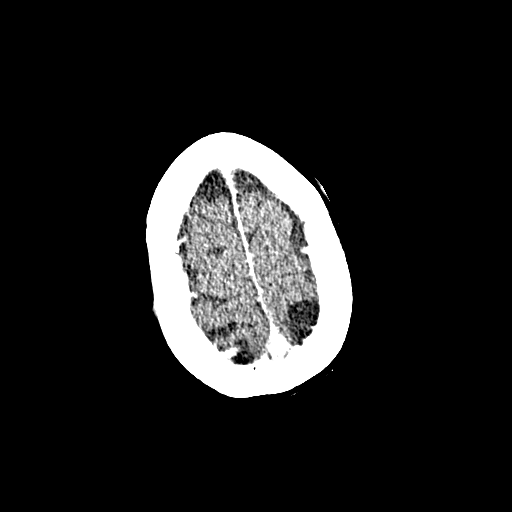
[im 69/77  bone]
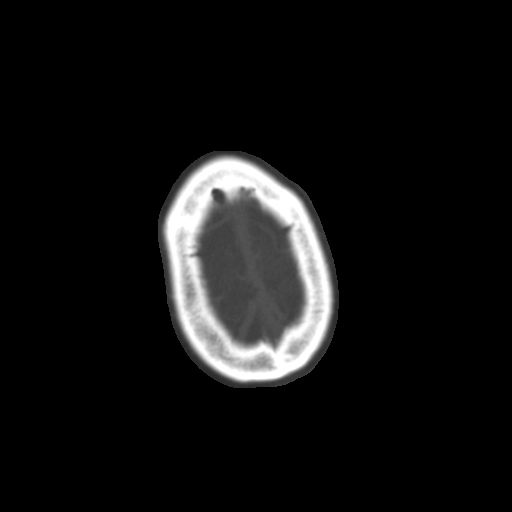
[im 74/77  brain]
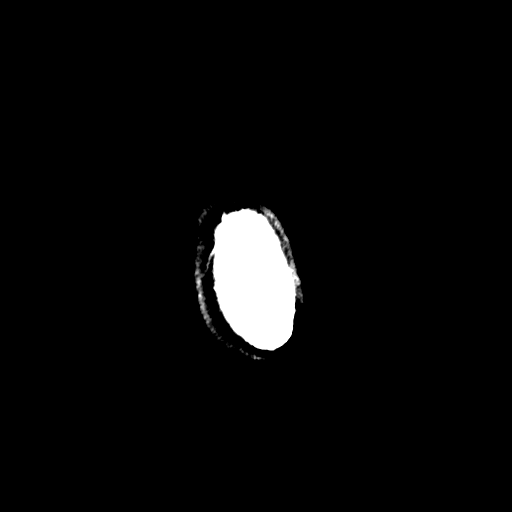

[19 of 47 positions shown; findings below may reference images not displayed]

FINDINGS: Normal enhancement seen throughout the superior sagittal sinus to
the torcula. Right transverse and sigmoid sinuses are widely patent
as is the visualized proximal right internal jugular vein. Left
transverse sinus hypoplastic but patent as well. Left sigmoid sinus
patent as is the visualized proximal left internal jugular vein.
Previously noted signal abnormality on prior MRI most consistent
with slow/sluggish flow. Superimposed arachnoid granulation at the
left transverse sinus noted. Straight sinus, vein of NADAUD, internal
cerebral veins, and basal veins of NADAUD appear patent. No
abnormality about the cavernous sinus. Superior orbital veins
symmetric. No appreciable cortical venous thrombosis.
IMPRESSION: Normal intracranial CTV. No evidence for dural venous thrombosis.
Previously noted signal abnormality consistent with slow/sluggish
flow.

## 2021-02-27 IMAGING — MR MR HEAD W/O CM
11 of 12 series · 40 of 48 positions shown · non-contrast
Comparison: [DATE] CT head, no prior MRI.

CLINICAL DATA: TIA, fall

EXAM:
MRI HEAD WITHOUT CONTRAST
TECHNIQUE: Multiplanar, multiecho pulse sequences of the brain and surrounding
structures were obtained without intravenous contrast.

[Series 5: DWI · axial · 4.0mm · 0.88mm/px · z∈[-35,+104]mm · 4 of 36 slices shown (1 of 6)]
[im 1/36]
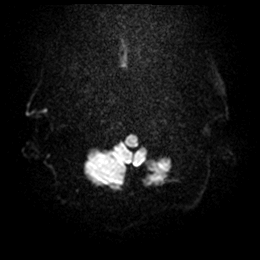
[im 12/36]
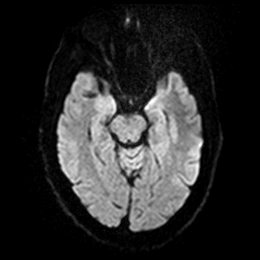
[im 24/36]
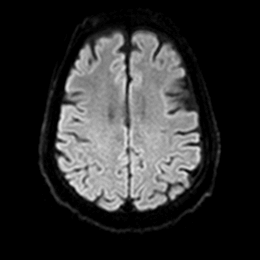
[im 36/36]
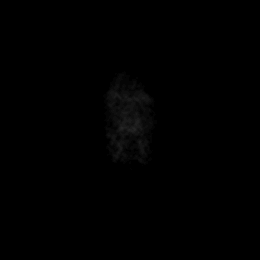

[Series 5: DWI · axial · 4.0mm · 0.88mm/px · z∈[-35,+104]mm · 4 of 36 slices shown (2 of 6)]
[im 1/36]
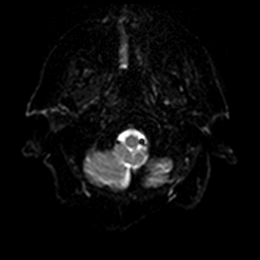
[im 12/36]
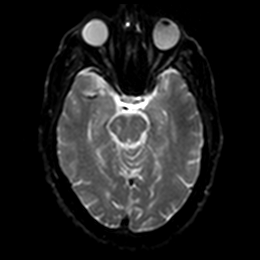
[im 24/36]
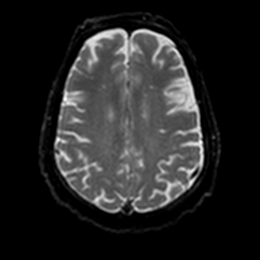
[im 36/36]
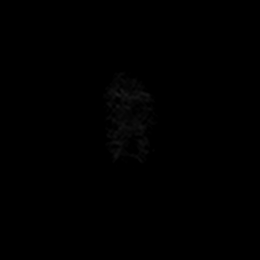

[Series 6: DWI · axial · 4.0mm · 0.88mm/px · z∈[-35,+96]mm · 3 of 34 slices shown (3 of 6)]
[im 1/34]
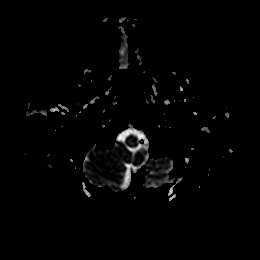
[im 17/34]
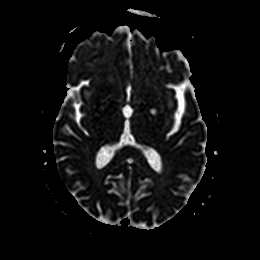
[im 34/34]
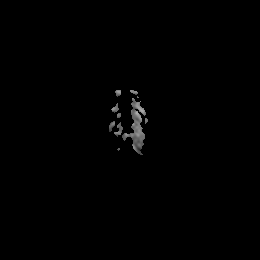

[Series 7: DWI · coronal · 5.0mm · 0.88mm/px · 4 of 30 slices shown (4 of 6)]
[im 1/30]
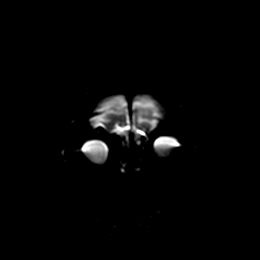
[im 10/30]
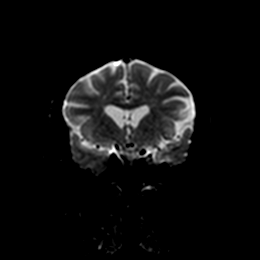
[im 20/30]
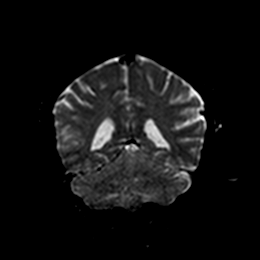
[im 30/30]
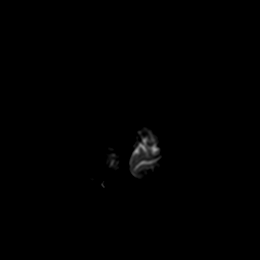

[Series 7: DWI · coronal · 5.0mm · 0.88mm/px · 4 of 30 slices shown (5 of 6)]
[im 1/30]
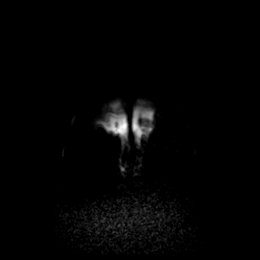
[im 10/30]
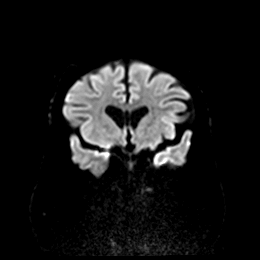
[im 20/30]
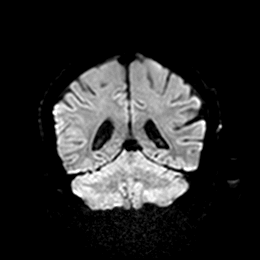
[im 30/30]
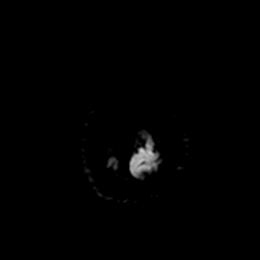

[Series 8: DWI · coronal · 5.0mm · 0.88mm/px · 4 of 30 slices shown (6 of 6)]
[im 1/30]
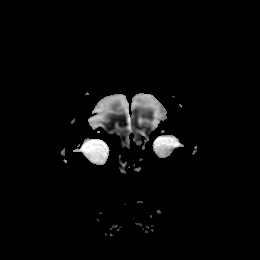
[im 10/30]
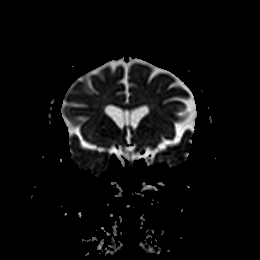
[im 20/30]
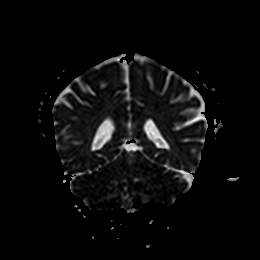
[im 30/30]
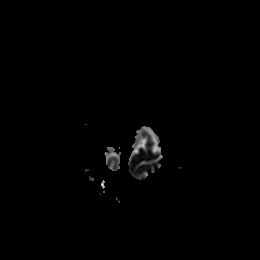

[Series 9: T1 · sagittal · 5.0mm · 0.94mm/px · 3 of 21 slices shown]
[im 1/21]
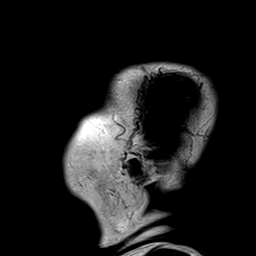
[im 11/21]
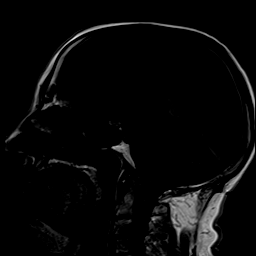
[im 21/21]
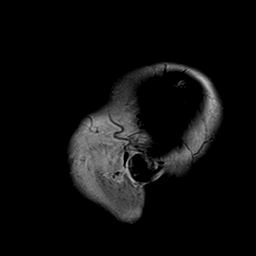

[Series 10: T2 · axial · 5.0mm · 0.72mm/px · z∈[-32,+101]mm · 3 of 20 slices shown (1 of 2)]
[im 1/20]
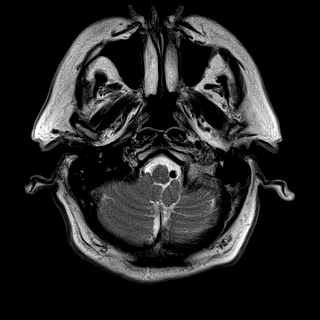
[im 10/20]
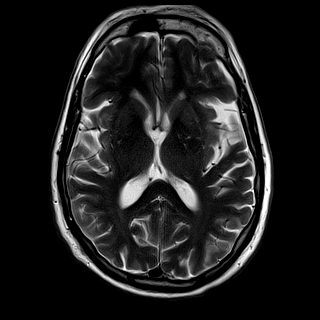
[im 20/20]
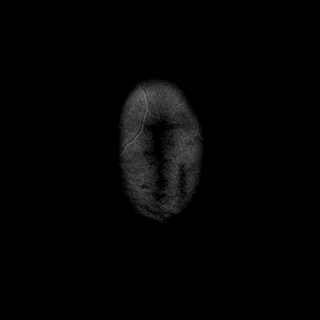

[Series 11: ax hemo · axial · 5.0mm · 0.86mm/px · z∈[-37,+107]mm · 3 of 25 slices shown]
[im 1/25]
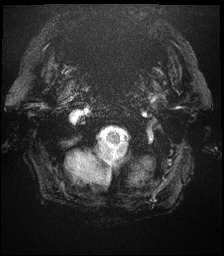
[im 13/25]
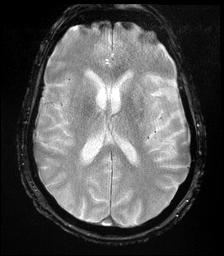
[im 25/25]
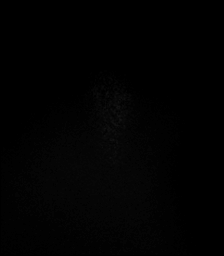

[Series 12: FLAIR · axial · 4.0mm · 0.43mm/px · z∈[-27,+97]mm · 4 of 32 slices shown]
[im 1/32]
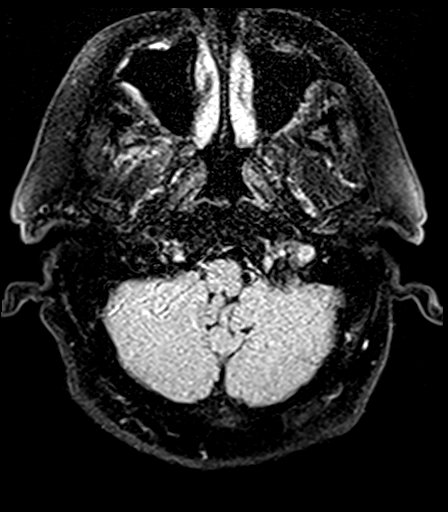
[im 11/32]
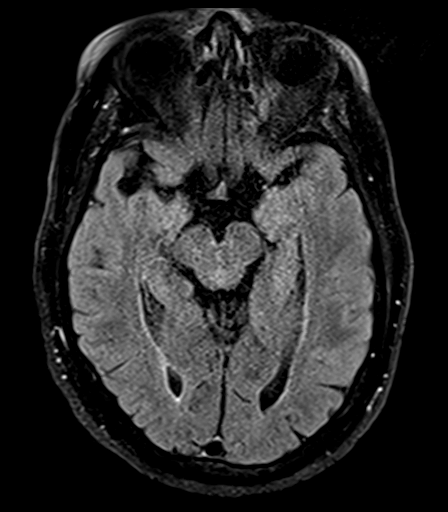
[im 21/32]
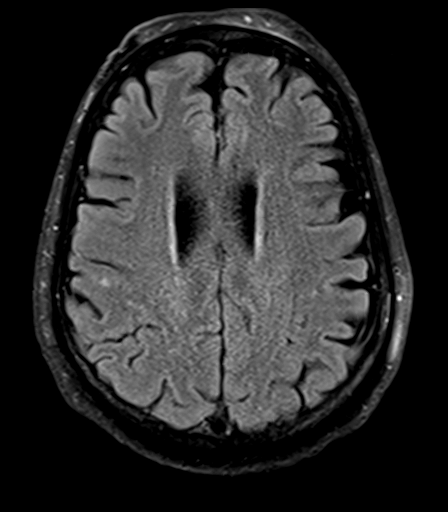
[im 32/32]
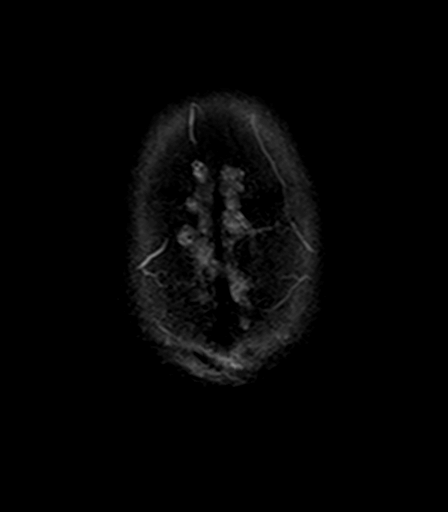

[Series 14: T2 · coronal · 5.0mm · 0.72mm/px · 4 of 28 slices shown (2 of 2)]
[im 1/28]
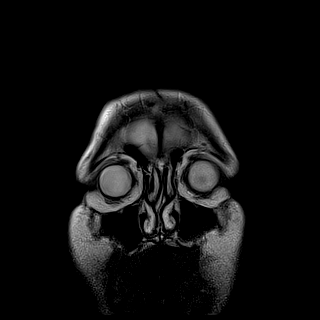
[im 10/28]
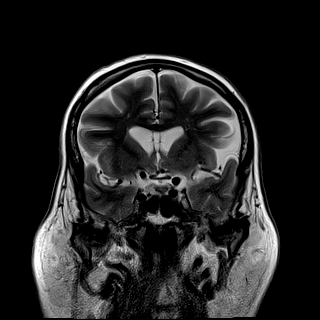
[im 19/28]
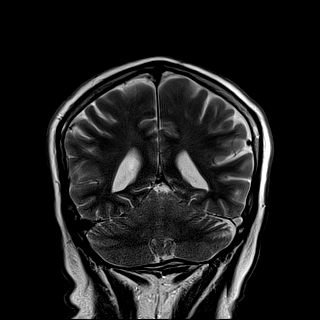
[im 28/28]
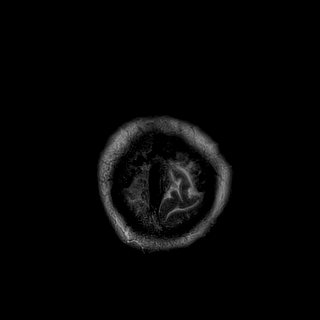

[40 of 48 positions shown; findings below may reference images not displayed]

FINDINGS: Brain: No restricted diffusion to suggest acute or subacute infarct.
No acute hemorrhage, mass, mass effect, or midline shift. Remote
lacunar infarct in the left basal ganglia. Scattered T2 hyperintense
signal in the periventricular white matter, likely the sequela of
mild chronic small vessel ischemic disease. No hydrocephalus or
extra-axial collection.

Vascular: Normal arterial flow voids. Loss of the left transverse
and sigmoid venous sinus flow void, extending into the left jugular
foramen likely reflecting slow flow.

Skull and upper cervical spine: Normal marrow signal.

Sinuses/Orbits: Negative.

Other: Trace fluid in the right mastoid air cells.
IMPRESSION: 1. No acute parenchymal abnormality.
2. Loss of the left transverse and sigmoid venous sinus flow void,
which extends into the left jugular foramen, likely reflecting slow
flow, although this can also be seen in the setting of venous sinus
thrombosis. Consider CT venogram if clinically indicated.

These results were called by telephone at the time of interpretation
on [DATE] at [DATE] to provider OLU , who verbally
acknowledged these results.

## 2021-02-27 IMAGING — DX DG SHOULDER 2+V*R*
2 series · 2 of 2 positions shown · non-contrast
Comparison: None.

CLINICAL DATA: Fall.  Right shoulder pain with

EXAM:
RIGHT SHOULDER - 2+ VIEW

[shoulder grashey]
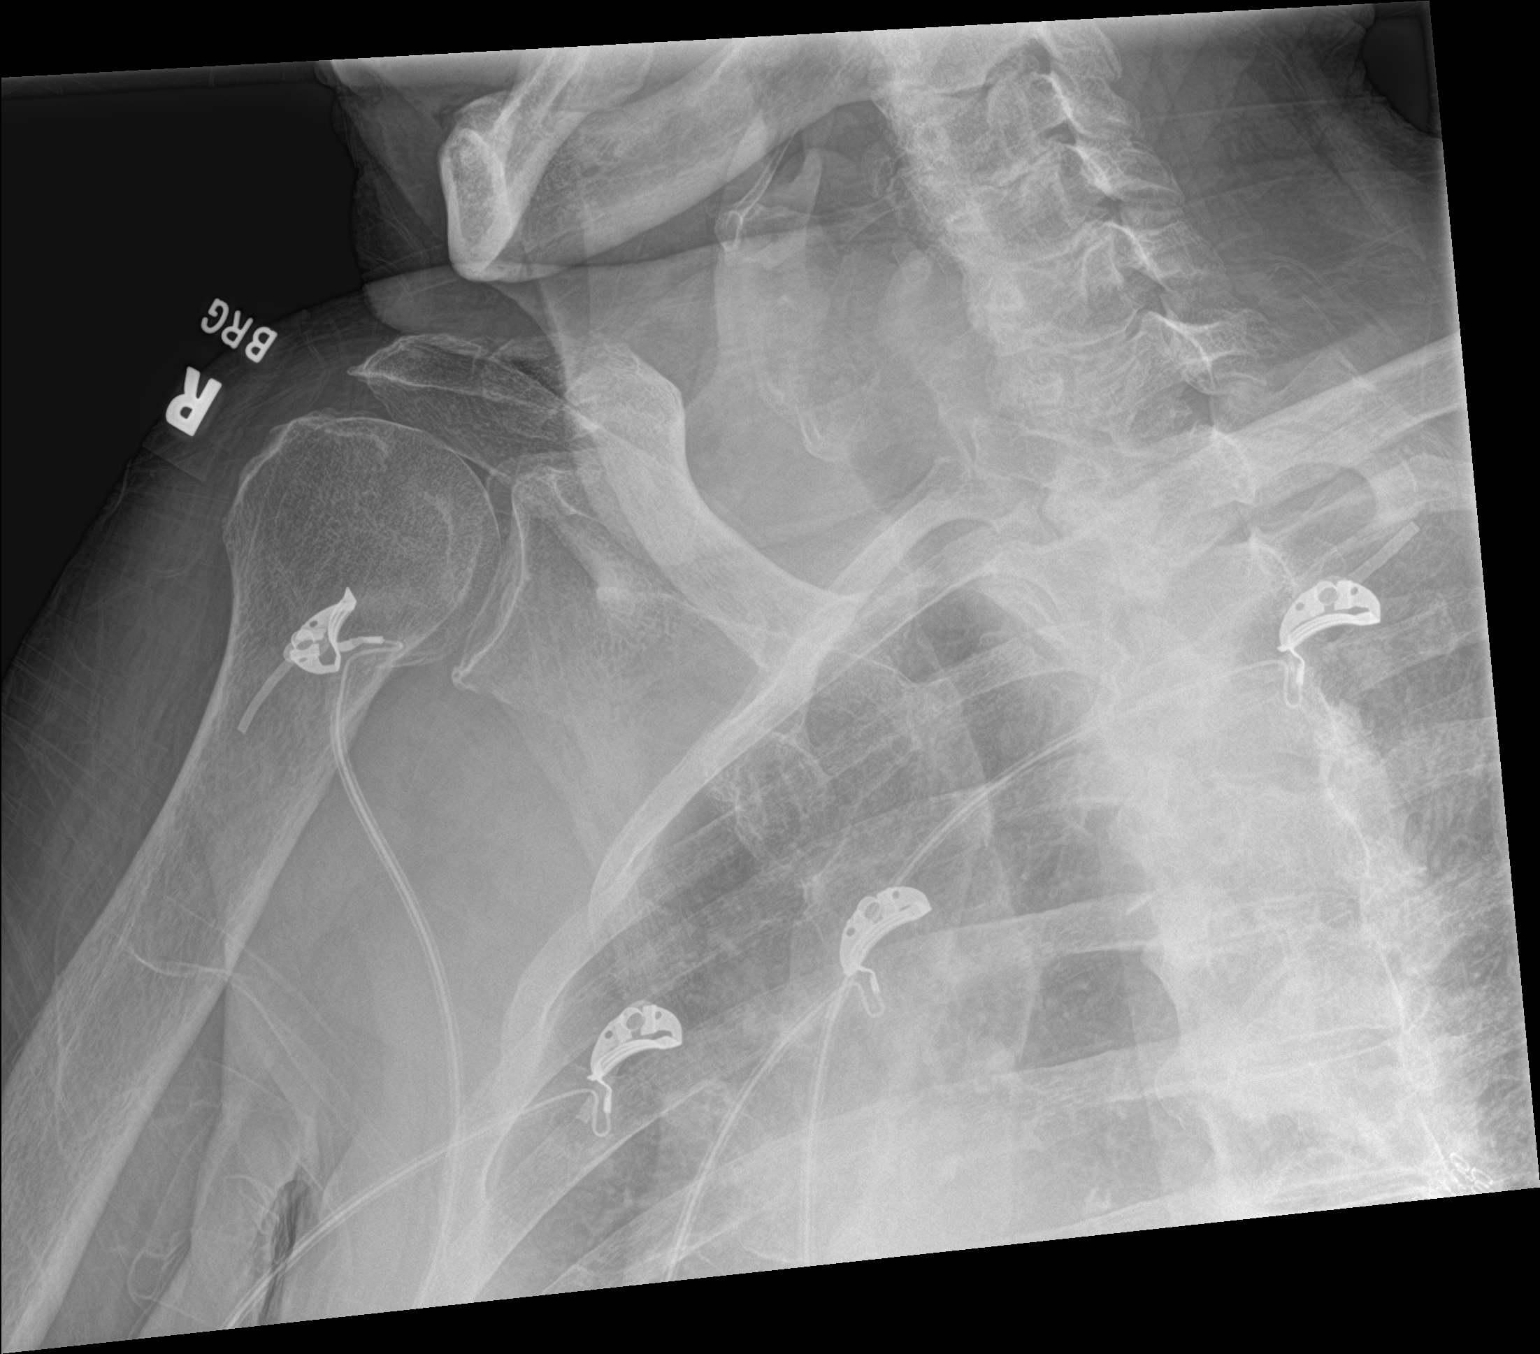

[shoulder y view]
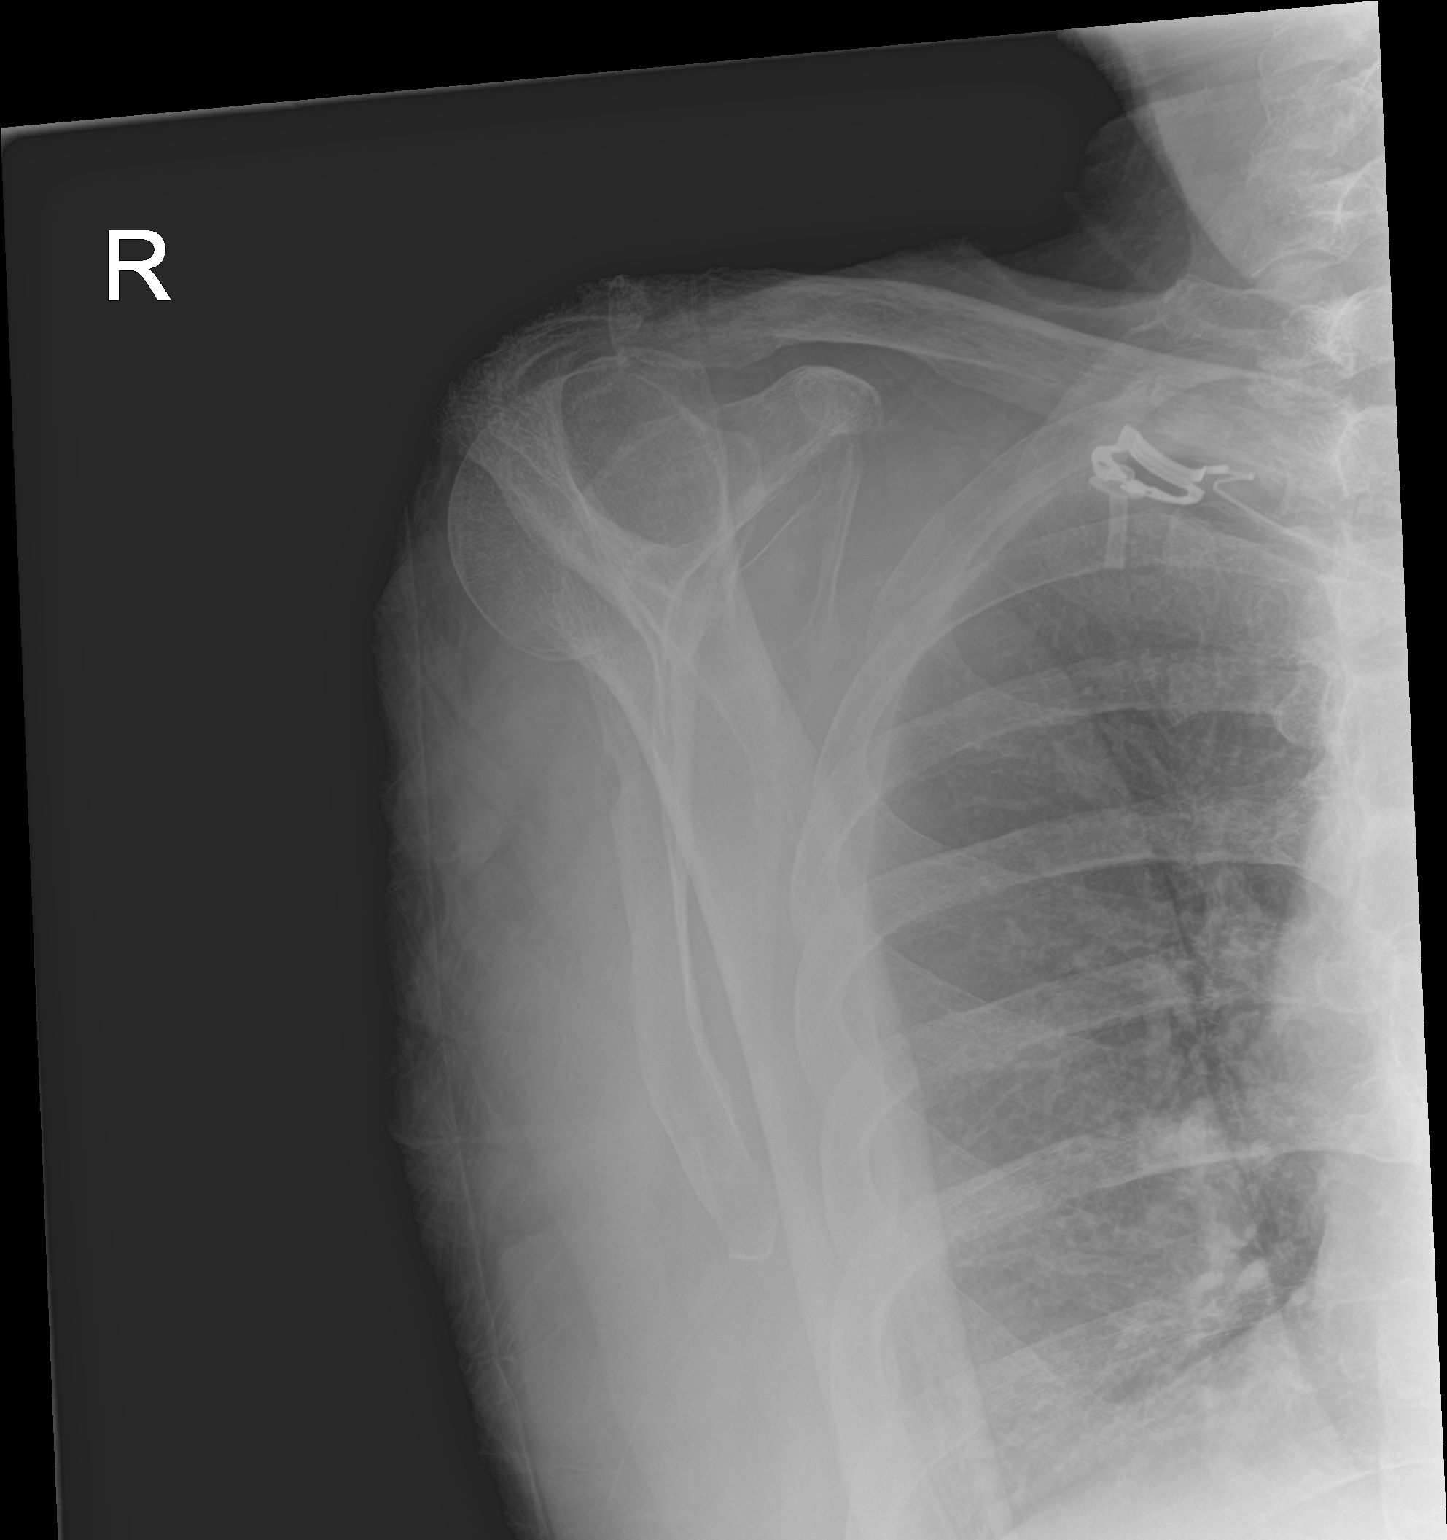

[2 of 2 positions shown; findings below may reference images not displayed]

FINDINGS: There is no evidence of fracture or dislocation. There is no
evidence of arthropathy or other focal bone abnormality. Soft
tissues are unremarkable.
IMPRESSION: Negative.

## 2021-02-27 MED ORDER — IOHEXOL 300 MG/ML  SOLN
75.0000 mL | Freq: Once | INTRAMUSCULAR | Status: AC | PRN
  Administered 2021-02-27: 75 mL via INTRAVENOUS

## 2021-02-27 MED ORDER — TETANUS-DIPHTH-ACELL PERTUSSIS 5-2.5-18.5 LF-MCG/0.5 IM SUSY
0.5000 mL | PREFILLED_SYRINGE | Freq: Once | INTRAMUSCULAR | Status: AC
  Administered 2021-02-27: 0.5 mL via INTRAMUSCULAR
  Filled 2021-02-27: qty 0.5

## 2021-02-27 NOTE — Discharge Instructions (Signed)
Clean abrasions to forehead twice a day with soap and water.  Follow-up with your doctor if any problem

## 2021-02-27 NOTE — ED Provider Notes (Signed)
CT venogram negative.  Patient will be discharged home   Milton Ferguson, MD 02/27/21 2224

## 2021-02-27 NOTE — ED Notes (Signed)
Patient transported to MRI 

## 2021-02-27 NOTE — ED Triage Notes (Signed)
Pt to the ED with [facility] after a fall where he hit his head.  Swelling and laceration noted to forehead with bleeding controlled.

## 2021-02-27 NOTE — ED Provider Notes (Signed)
[facility] EMERGENCY DEPARTMENT Provider Note   CSN: [id number] Arrival date & time: 02/27/21  1447     History  Chief Complaint  Patient presents with   Richard Simmons is a 76 y.o. male.  HPI  Patient with medical history including diabetes, neuropathy, chronic back pain, hypertension presents with complaints of fall.  Patient states today after he got home from his primary care visit he got out of his car lost his balance and fell onto the ground.  He states that his head hit the hood of the car and then he fell onto his right side.  He denies losing conscious, is not on anticoagulant.  He states that he was unable to get up and needed assistance via the fire department.  He states after the fall he is having some right shoulder pain.  He does not endorse headaches, change in vision, paresthesias or weakness of the upper/ lower extremities.  Denies any neck, back pain, chest pain or abdominal pain.  He states that he simply lost his balance but he does endorse that over the last week he is become more off balance, states that normally he Can walk without a walker but now needs a walker to move around without falling.  He has had about 4 falls over the last week's time, he states that he recently was increased on his gabapentin but this was performed 2 weeks ago, states the off balance to start about a week ago.  He has no other complaints at this time.  Home Medications Prior to Admission medications   Medication Sig Start Date End Date Taking? Authorizing Provider  acetaminophen (TYLENOL) 325 MG tablet Take 325-650 mg by mouth every 6 (six) hours as needed (pain).    [provider]  amitriptyline (ELAVIL) 75 MG tablet Take 75 mg by mouth at bedtime.    [provider]  citalopram (CELEXA) 40 MG tablet Take 40 mg by mouth at bedtime.     [provider]  clonazePAM (KLONOPIN) 0.5 MG tablet Take 0.5 mg by mouth at bedtime.     [provider]   codeine 30 MG tablet Take 30-60 mg by mouth every 8 (eight) hours as needed for moderate pain.     [provider]  ferrous sulfate 325 (65 FE) MG tablet Take 325 mg by mouth daily with breakfast.    [provider]  gabapentin (NEURONTIN) 400 MG capsule Take 400 mg by mouth 3 (three) times daily.     [provider]  LUBRICATING PLUS EYE DROPS 0.5 % SOLN Place 1 drop into both eyes 3 (three) times daily.    [provider]  metFORMIN (GLUCOPHAGE) 850 MG tablet Take 850 mg by mouth 2 (two) times daily with a meal.    [provider]  pantoprazole (PROTONIX) 40 MG tablet Take 1 Tablet by mouth 2 times a day before meals Patient taking differently: Take 40 mg by mouth daily before breakfast.  12/22/16   Annitta Needs, NP      Allergies    Patient has no known allergies.    Review of Systems   Review of Systems  Constitutional:  Negative for chills and fever.  HENT:  Negative for congestion.   Respiratory:  Negative for shortness of breath.   Cardiovascular:  Negative for chest pain.  Gastrointestinal:  Negative for abdominal pain.  Genitourinary:  Negative for enuresis.  Musculoskeletal:  Negative for back pain.  Right shoulder pain  Skin:  Negative for rash.  Neurological:  Negative for dizziness.  Hematological:  Does not bruise/bleed easily.   Physical Exam Updated Vital Signs BP (!) 145/82    Pulse 79    Temp 98.5 F (36.9 C) (Oral)    Resp 18    Ht 5\' 10"  (1.778 m)    Wt 100.2 kg    SpO2 97%    BMI 31.70 kg/m  Physical Exam Vitals and nursing note reviewed.  Constitutional:      General: He is not in acute distress.    Appearance: He is not ill-appearing.  HENT:     Head: Normocephalic and atraumatic.     Comments: Abrasions noted on patient's forehead as well as bridge of his nose, superficial, hemodynamically stable, no raccoon eyes about sign present, no other gross deformities to head noted.    Nose: No congestion.      Mouth/Throat:     Mouth: Mucous membranes are moist.     Pharynx: Oropharynx is clear. No oropharyngeal exudate or posterior oropharyngeal erythema.     Comments: No trismus or torticollis present, no oral trauma present Eyes:     Extraocular Movements: Extraocular movements intact.     Conjunctiva/sclera: Conjunctivae normal.     Pupils: Pupils are equal, round, and reactive to light.  Cardiovascular:     Rate and Rhythm: Normal rate and regular rhythm.     Pulses: Normal pulses.     Heart sounds: No murmur heard.   No friction rub. No gallop.  Pulmonary:     Effort: No respiratory distress.     Breath sounds: No wheezing, rhonchi or rales.  Chest:     Chest wall: No tenderness.  Abdominal:     Palpations: Abdomen is soft.     Tenderness: There is no abdominal tenderness. There is no right CVA tenderness or left CVA tenderness.  Musculoskeletal:     Comments: Spine was palpated was nontender to patient, no step-off or deformities present.  Skin:    General: Skin is warm and dry.  Neurological:     Mental Status: He is alert.     Coordination: Finger-Nose-Finger Test normal.     Comments: Cranial nerves II through XII grossly intact, no difficult word finding, no slurring of words, no unilateral weakness present.  Does have slight decreased range of motion motion of his fingers of his right hand, patient was able to ambulate but needed use of a walker as he was off balance.  Psychiatric:        Mood and Affect: Mood normal.    ED Results / Procedures / Treatments   Labs (all labs ordered are listed, but only abnormal results are displayed) Labs Reviewed  CBC WITH DIFFERENTIAL/PLATELET  BASIC METABOLIC PANEL  URINALYSIS, ROUTINE W REFLEX MICROSCOPIC    EKG EKG Interpretation  Date/Time:  Thursday February 27 2021 16:40:39 EST Ventricular Rate:  79 PR Interval:  146 QRS Duration: 113 QT Interval:  378 QTC Calculation: 434 R Axis:   -66 Text Interpretation: Sinus  rhythm Left anterior fascicular block Abnormal R-wave progression, early transition Confirmed by Milton Ferguson [phone]) on 02/27/2021 5:46:00 PM  Radiology DG Shoulder Right  Result Date: 02/27/2021 CLINICAL DATA:  Fall.  Right shoulder pain with EXAM: RIGHT SHOULDER - 2+ VIEW COMPARISON:  None. FINDINGS: There is no evidence of fracture or dislocation. There is no evidence of arthropathy or other focal bone abnormality. Soft tissues are unremarkable. IMPRESSION: Negative. Electronically  Signed   By: Franchot Gallo M.D.   On: 02/27/2021 17:40   CT Head Wo Contrast  Result Date: 02/27/2021 CLINICAL DATA:  Head trauma, minor (Age >= 65y) Fall striking head.  Laceration to forehead. EXAM: CT HEAD WITHOUT CONTRAST TECHNIQUE: Contiguous axial images were obtained from the base of the skull through the vertex without intravenous contrast. COMPARISON:  None. FINDINGS: Brain: Generalized atrophy is normal for age. Minor periventricular chronic small vessel ischemia. There is a remote lacunar infarct in the left basal ganglia. No intracranial hemorrhage, mass effect, or midline shift. No hydrocephalus. The basilar cisterns are patent. No evidence of territorial infarct or acute ischemia. No extra-axial or intracranial fluid collection. Vascular: Atherosclerosis of skullbase vasculature without hyperdense vessel or abnormal calcification. Skull: No fracture or focal lesion. Sinuses/Orbits: Assessed on concurrent face CT, reported separately. Other: Left frontal scalp hematoma. IMPRESSION: 1. Left frontal scalp hematoma. No acute intracranial abnormality. No skull fracture. 2. Age related atrophy and chronic small vessel ischemia. Remote lacunar infarct in the left basal ganglia. Electronically Signed   By: Keith Rake M.D.   On: 02/27/2021 17:26   MR BRAIN WO CONTRAST  Result Date: 02/27/2021 CLINICAL DATA:  TIA, fall EXAM: MRI HEAD WITHOUT CONTRAST TECHNIQUE: Multiplanar, multiecho pulse sequences of the brain  and surrounding structures were obtained without intravenous contrast. COMPARISON:  02/27/2021 CT head, no prior MRI. FINDINGS: Brain: No restricted diffusion to suggest acute or subacute infarct. No acute hemorrhage, mass, mass effect, or midline shift. Remote lacunar infarct in the left basal ganglia. Scattered T2 hyperintense signal in the periventricular white matter, likely the sequela of mild chronic small vessel ischemic disease. No hydrocephalus or extra-axial collection. Vascular: Normal arterial flow voids. Loss of the left transverse and sigmoid venous sinus flow void, extending into the left jugular foramen likely reflecting slow flow. Skull and upper cervical spine: Normal marrow signal. Sinuses/Orbits: Negative. Other: Trace fluid in the right mastoid air cells. IMPRESSION: 1. No acute parenchymal abnormality. 2. Loss of the left transverse and sigmoid venous sinus flow void, which extends into the left jugular foramen, likely reflecting slow flow, although this can also be seen in the setting of venous sinus thrombosis. Consider CT venogram if clinically indicated. These results were called by telephone at the time of interpretation on 02/27/2021 at 6:42 pm to provider Deno Etienne , who verbally acknowledged these results. Electronically Signed   By: Merilyn Baba M.D.   On: 02/27/2021 18:42   CT Maxillofacial WO CM  Result Date: 02/27/2021 CLINICAL DATA:  Facial trauma, blunt Fall striking head. EXAM: CT MAXILLOFACIAL WITHOUT CONTRAST TECHNIQUE: Multidetector CT imaging of the maxillofacial structures was performed. Multiplanar CT image reconstructions were also generated. COMPARISON:  None. FINDINGS: Osseous: Cortical irregularity of the right nasal bone is of unknown acuity, series 3, image 29. No acute fracture of the zygomatic arches or mandibles. The temporomandibular joints are congruent. Patient is edentulous. There is rightward nasal septal deviation. Orbits: No acute orbital fracture.   No globe injury. Sinuses: No sinus fracture or fluid level. Paranasal sinuses are clear. No mastoid effusion. Soft tissues: Left supraorbital scalp hematoma. Limited intracranial: Assessed on concurrent head CT, reported separately. IMPRESSION: 1. Cortical irregularity of the right nasal bone is of unknown acuity, may be chronic, however recommend correlation for focal tenderness. 2. No additional acute facial bone fracture. Left supraorbital scalp hematoma. Electronically Signed   By: Keith Rake M.D.   On: 02/27/2021 17:28    Procedures Procedures  Medications Ordered in ED Medications  Tdap (BOOSTRIX) injection 0.5 mL (0.5 mLs Intramuscular Given 02/27/21 1754)    ED Course/ Medical Decision Making/ A&P                           Medical Decision Making  This patient presents to the ED for concern of fall, this involves an extensive number of treatment options, and is a complaint that carries with it a high risk of complications and morbidity.  The differential diagnosis includes) head bleed, CVA    Additional history obtained:  Additional history obtained from electronic medical records    Co morbidities that complicate the patient evaluation  Hypertension  Social Determinants of Health:  N/A    Lab Tests:  I Ordered, and personally interpreted labs.  The pertinent results include: CBC unremarkable   Imaging Studies ordered:  I ordered imaging studies including CT head maxillofacial right shoulder MRI without contrast of brain I independently visualized and interpreted imaging which showed CT head and maxillofacial both negative for acute findings, MRI brain shows no acute abnormalities but does show decrease venous flow concern for possible venous sinus thrombosis I agree with the radiologist interpretation   Cardiac Monitoring:  The patient was maintained on a cardiac monitor.  I personally viewed and interpreted the cardiac monitored which showed an  underlying rhythm of: EKG sinus without signs of ischemia    Reevaluation:  Patient was updated lab work and imaging, he was having difficulty ambulating, and concerns that he might of had a small stroke, will obtain MRI for further evaluation.  MRI showing concerns of possible venous sinus thrombosis, will consult with neurology for further recommendations.  Patient is updated on recommendation from neurology is aware and this plan will continue with CT scan for rule out.  Consultations Obtained:  I requested consultation with the spoke with Dr. Saralyn Pilar of neurology,  and discussed lab and imaging findings as well as pertinent plan - they recommend: He recommends obtaining CT venogram for rule out, if positive he recommends transfer down to [facility] for further treatment.  Test considered  CT chest and abdomen for rule out of thoracic and/or intra-abdominal trauma but will defer as there is no sign of trauma present my exam low suspicion for this at this time.   Rule out low suspicion for intracranial head bleed or CVA imaging is negative for acute findings.   Low suspicion for spinal cord abnormality or spinal fracture spine was palpated was nontender to palpation, patient has full range of motion in the upper and lower extremities.  Low suspicion for meningitis as he has no meningeal sign present my exam, no leukocytosis, afebrile.  Low suspicion for orthopedic injury as imaging negative for acute findings.     Dispostion:  Due to shift change patient will be handed off to Dr. Roderic Palau, he was provided HPI, current work-up, likely disposition  Follow-up on CT venogram if negative patient can be discharged home, I recommend that he stops taking gabapentin twice daily as this might be worsening his neuropathy.  If positive patient had be transferred down to [facility], admit to hospitalist team with neurology consultation, treatment includes anticoags.  Problem List / ED  Course:  Fall          Final Clinical Impression(s) / ED Diagnoses Final diagnoses:  Fall, initial encounter    Rx / DC Orders ED Discharge Orders     None  Marcello Fennel, PA-C 02/27/21 1943    Milton Ferguson, MD 02/28/21 [phone]

## 2021-03-31 ENCOUNTER — Other Ambulatory Visit: Payer: Self-pay | Admitting: Rehabilitation

## 2021-04-01 ENCOUNTER — Other Ambulatory Visit: Payer: Self-pay | Admitting: Rehabilitation

## 2021-04-01 DIAGNOSIS — S14109A Unspecified injury at unspecified level of cervical spinal cord, initial encounter: Secondary | ICD-10-CM

## 2021-04-01 DIAGNOSIS — M5116 Intervertebral disc disorders with radiculopathy, lumbar region: Secondary | ICD-10-CM

## 2021-04-09 ENCOUNTER — Ambulatory Visit
Admission: RE | Admit: 2021-04-09 | Discharge: 2021-04-09 | Disposition: A | Discharge status: Home / Self Care | Payer: Medicare Other | Source: Ambulatory Visit | Attending: Rehabilitation | Admitting: Rehabilitation

## 2021-04-09 DIAGNOSIS — M5116 Intervertebral disc disorders with radiculopathy, lumbar region: Secondary | ICD-10-CM

## 2021-04-09 DIAGNOSIS — S14109A Unspecified injury at unspecified level of cervical spinal cord, initial encounter: Secondary | ICD-10-CM

## 2021-04-09 IMAGING — MR MR LUMBAR SPINE WO/W CM
4 of 7 series · 27 of 48 positions shown · IV contrast (multihance)
Comparison: None.

CLINICAL DATA: Gait problems with recurrent falls over the last 2
months. Neuropathy.

EXAM:
MRI LUMBAR SPINE WITHOUT AND WITH CONTRAST
TECHNIQUE: Multiplanar and multiecho pulse sequences of the lumbar spine were
obtained without and with intravenous contrast.
CONTRAST:  20mL MULTIHANCE GADOBENATE DIMEGLUMINE 529 MG/ML IV SOLN

[Series 5: T2 · sagittal · 4.0mm · 0.57mm/px · 3 of 15 slices shown (1 of 2)]
[im 1/15]
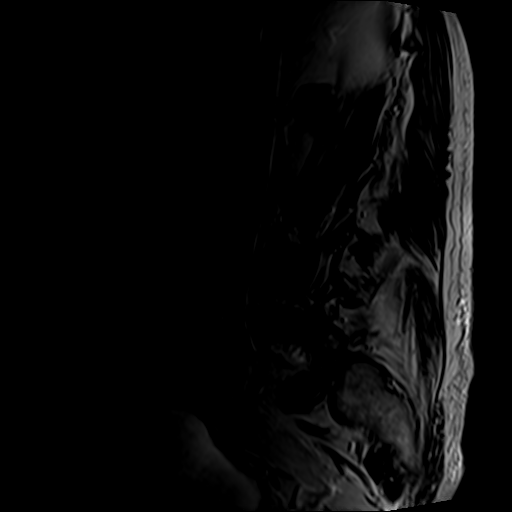
[im 8/15]
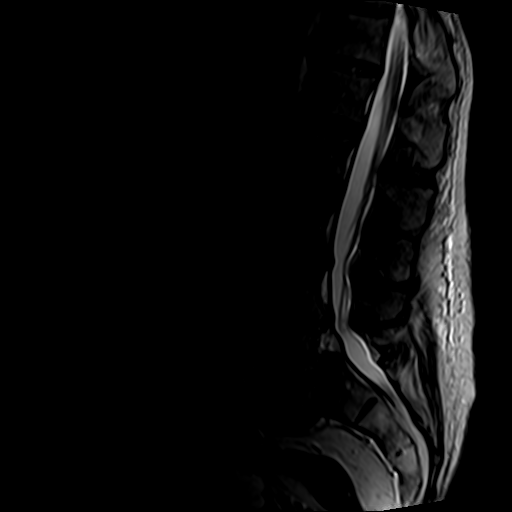
[im 15/15]
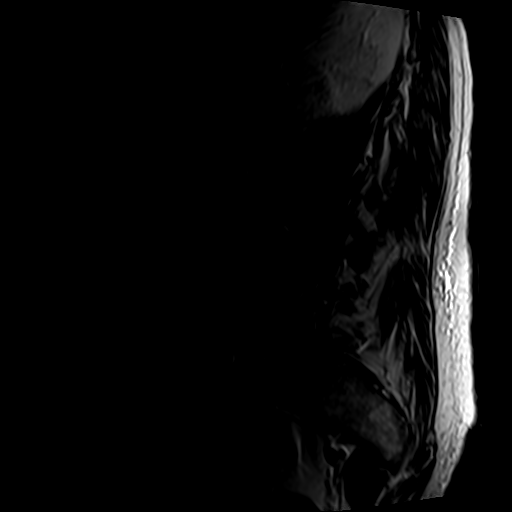

[Series 7: T1 · sagittal · 4.0mm · 0.57mm/px · 4 of 15 slices shown (1 of 2)]
[im 1/15]
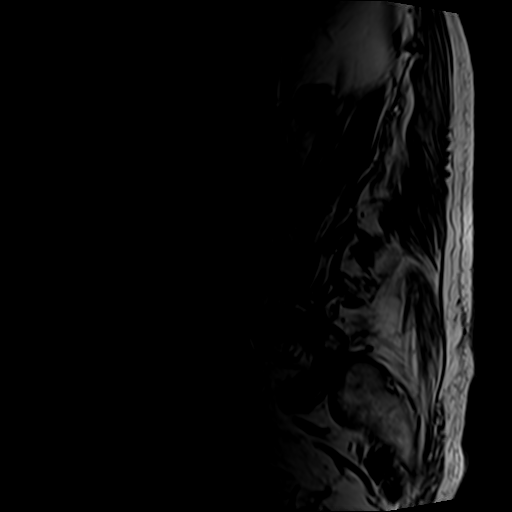
[im 5/15]
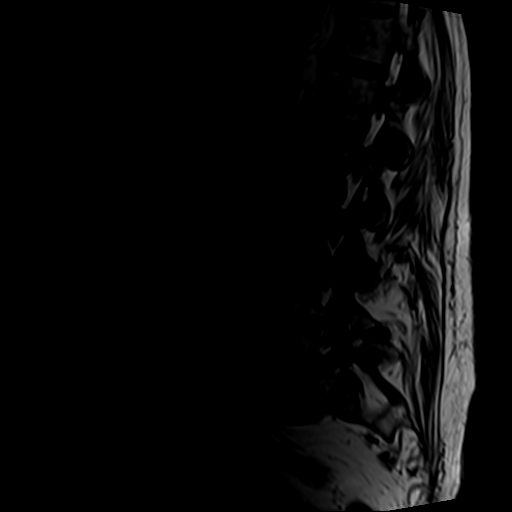
[im 10/15]
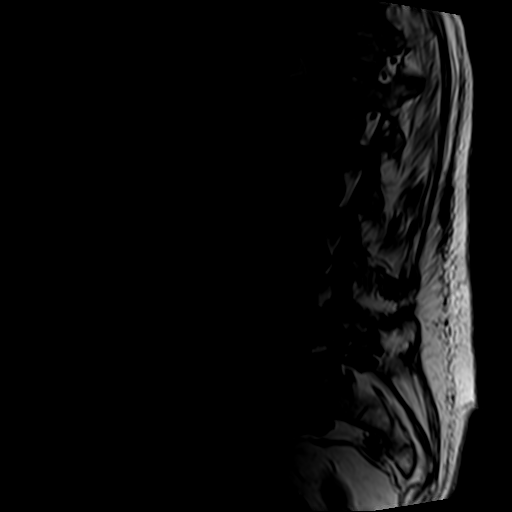
[im 15/15]
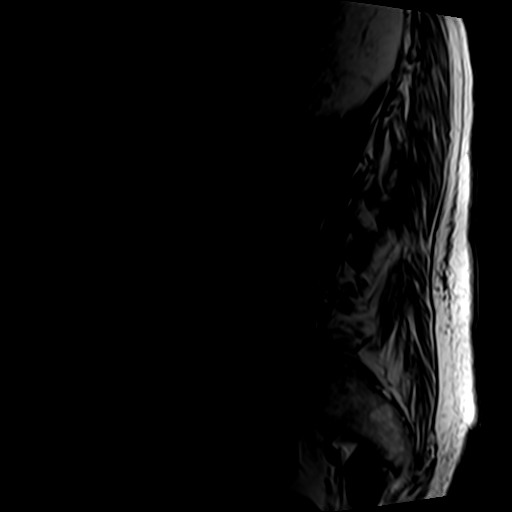

[Series 8: T2 · axial · 4.0mm · 0.70mm/px · z∈[-531,-304]mm · 11 of 40 slices shown (2 of 2)]
[im 1/40]
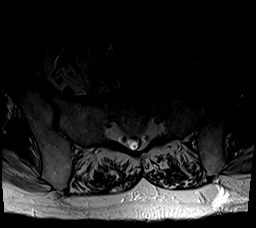
[im 4/40]
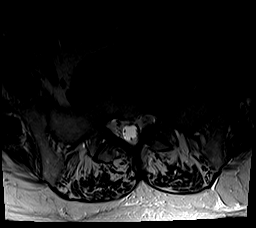
[im 8/40]
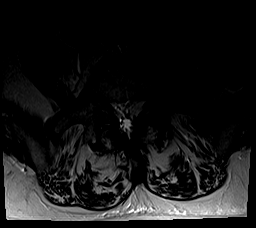
[im 12/40]
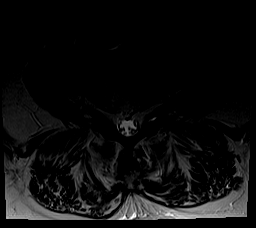
[im 16/40]
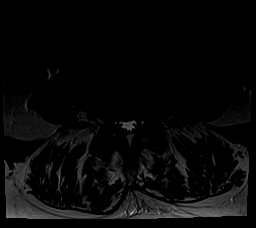
[im 20/40]
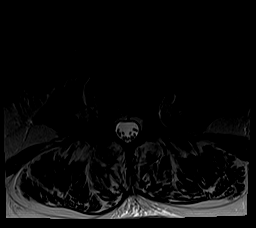
[im 24/40]
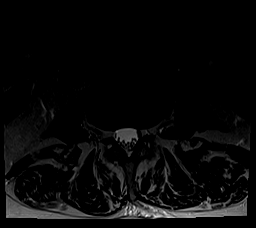
[im 28/40]
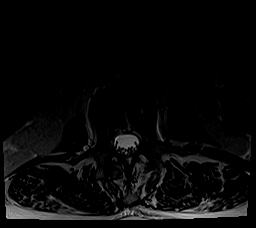
[im 32/40]
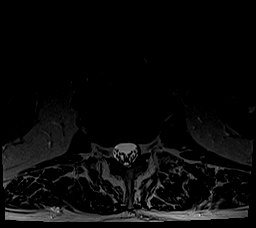
[im 36/40]
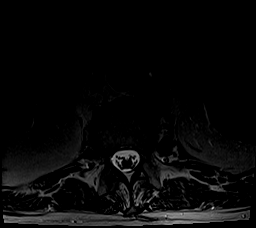
[im 40/40]
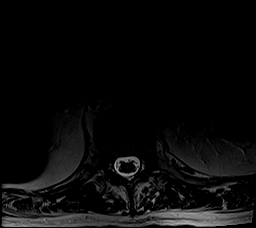

[Series 9: T1 · axial · 4.0mm · 0.35mm/px · z∈[-531,-325]mm · 9 of 40 slices shown (2 of 2)]
[im 1/40]
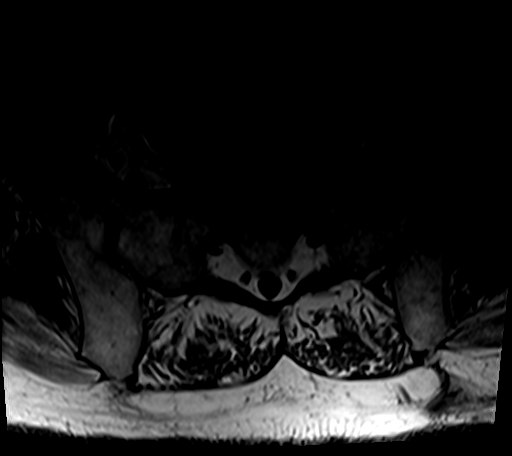
[im 4/40]
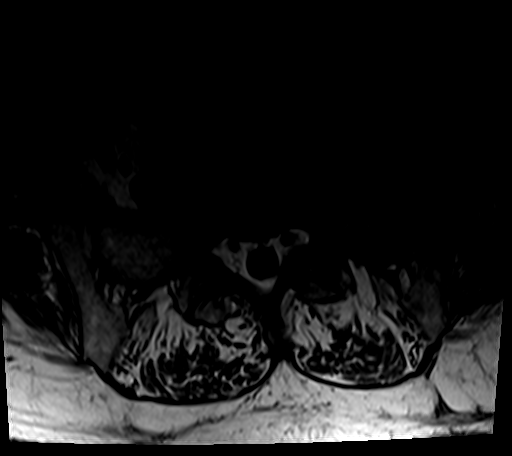
[im 8/40]
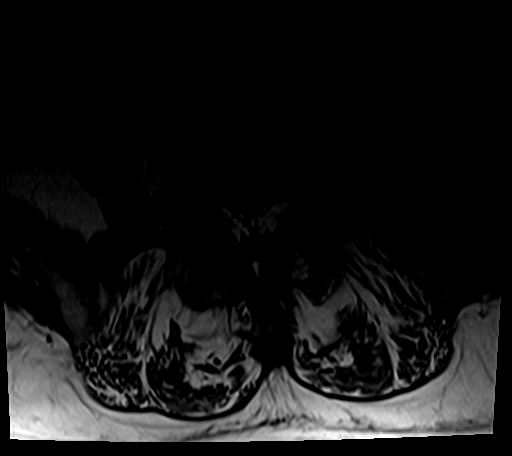
[im 12/40]
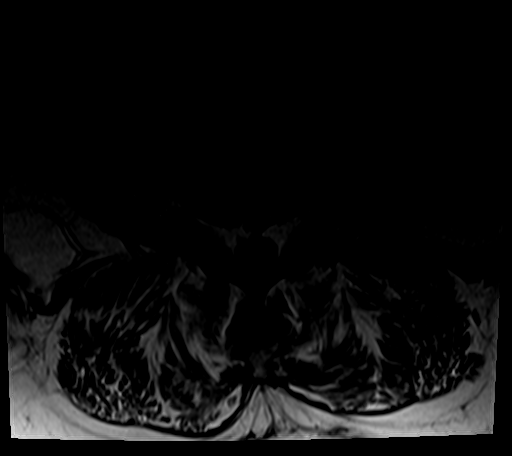
[im 16/40]
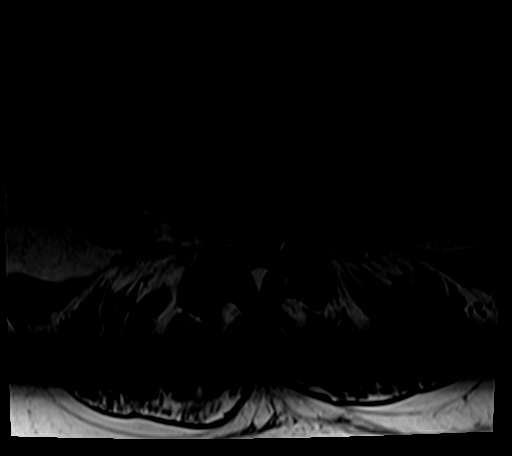
[im 20/40]
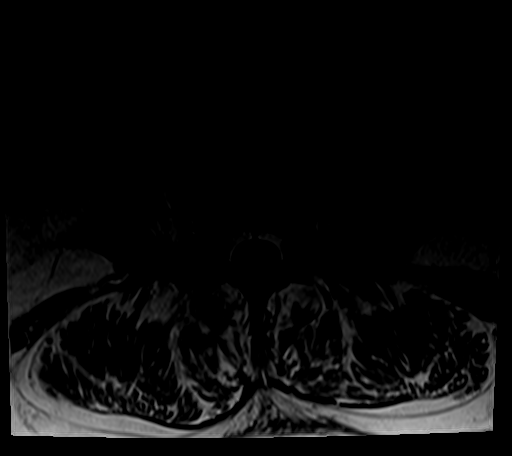
[im 24/40]
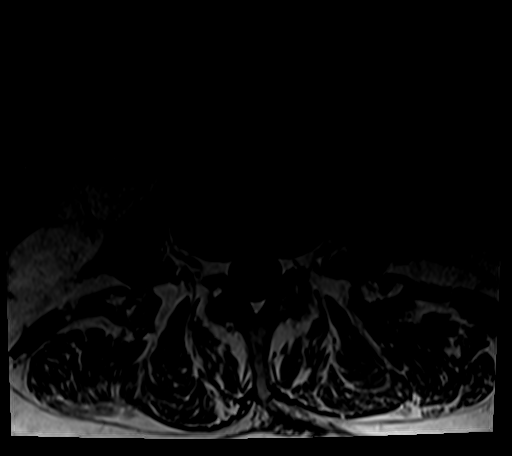
[im 28/40]
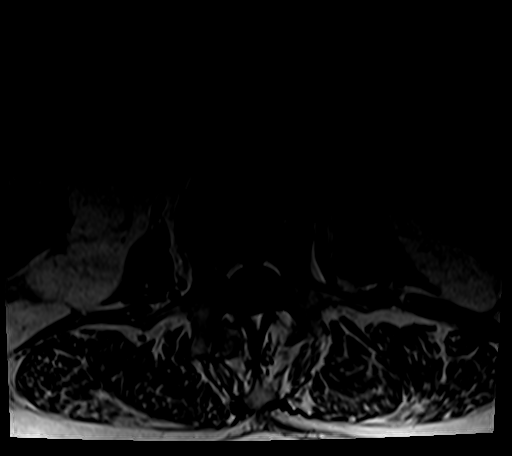
[im 36/40]
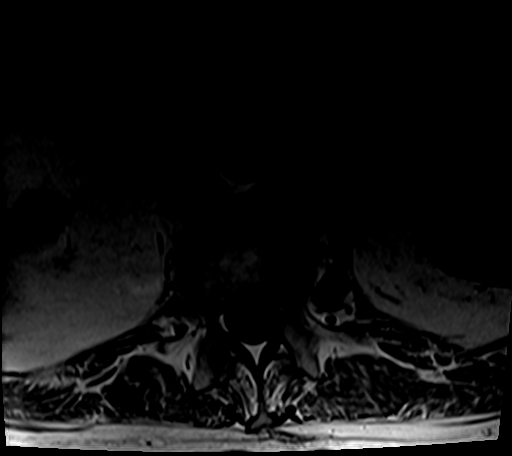

[27 of 48 positions shown; findings below may reference images not displayed]

FINDINGS: Segmentation: Conventional anatomy assumed, with the last open disc
space designated L5-S1.

Alignment: 9 mm of anterolisthesis at L5-S1 secondary to chronic
bilateral L5 pars defects. The alignment is otherwise normal.

Vertebrae: As above, chronic bilateral L5 pars defects. No evidence
of acute fracture or focal osseous lesion. There are scattered
endplate degenerative changes, greatest at L4-5 and L5-S1. No
abnormal osseous enhancement. The sacroiliac joints appear partially
ankylosed.

Conus medullaris: Extends to the L1 level and appears normal. No
abnormal intradural enhancement.

Paraspinal and other soft tissues: No significant paraspinal
findings. Multilevel paraspinal osteophytes.

Disc levels:

From T12-L1 through L2-3, there are prominent anterior osteophytes,
although the disc height and hydration are maintained. There is no
significant spinal stenosis or nerve root encroachment.

L3-4: Loss of disc height with annular disc bulging and prominent
anterior osteophyte formation. Moderate facet and ligamentous
hypertrophy. Resulting borderline spinal stenosis and mild lateral
recess narrowing. The foramina are patent.

L4-5: Chronic degenerative disc disease with loss of disc height,
annular disc bulging and endplate osteophytes. Moderate facet and
ligamentous hypertrophy. Resulting mild spinal stenosis with mild to
moderate lateral recess and foraminal narrowing bilaterally.

L5-S1: Chronic degenerative disc disease with loss of disc height,
annular disc bulging and endplate osteophytes. As above, underlying
bilateral chronic L5 pars defects with resulting grade 1
anterolisthesis. There is resulting severe foraminal narrowing
bilaterally with probable chronic bilateral L5 nerve root
encroachment. There is also mild narrowing of the left lateral
recess.
IMPRESSION: 1. No acute findings are seen within the lumbar spine. Prominent
multilevel endplate osteophytes.
2. Chronic bilateral L5 pars defects with resulting grade 1
anterolisthesis and severe foraminal narrowing bilaterally at L5-S1.
3. Mild multifactorial spinal stenosis with mild to moderate lateral
recess and foraminal narrowing bilaterally at L4-5.
4. Cervical spine findings dictated separately.

## 2021-04-09 IMAGING — MR MR CERVICAL SPINE WO/W CM
8 of 15 series · 26 of 48 positions shown · IV contrast (19 ml multihance)
Comparison: Limited correlation made with cranial MRI [DATE]
and maxillofacial CT [DATE].

CLINICAL DATA: Gait problems with falling over the last 2 months.
Neuropathy.

EXAM:
MRI CERVICAL SPINE WITHOUT AND WITH CONTRAST
TECHNIQUE: Multiplanar and multiecho pulse sequences of the cervical spine, to
include the craniocervical junction and cervicothoracic junction,
were obtained without and with intravenous contrast.
CONTRAST:  20mL MULTIHANCE GADOBENATE DIMEGLUMINE 529 MG/ML IV SOLN

[Series 3: T2 · sagittal · 3.0mm · 0.66mm/px · 1 of 15 slices shown (1 of 4)]
[im 1/15]
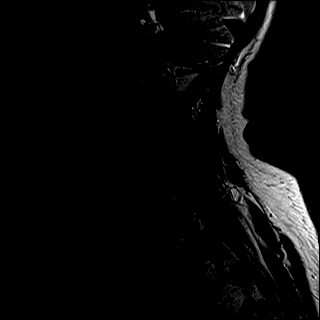

[Series 4: T1 · sagittal · 3.0mm · 0.41mm/px · 1 of 15 slices shown (1 of 4)]
[im 1/15]
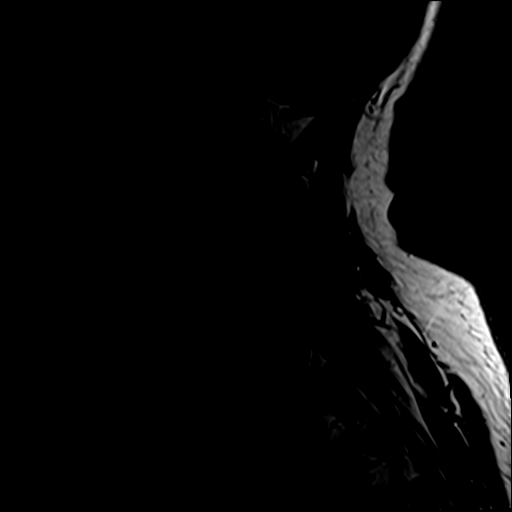

[Series 7: T2 · axial · 3.0mm · 0.70mm/px · z∈[-73,+20]mm · 4 of 27 slices shown (2 of 4)]
[im 1/27]
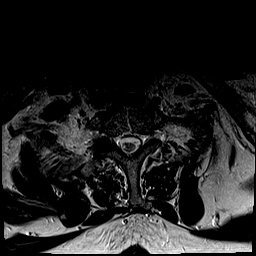
[im 9/27]
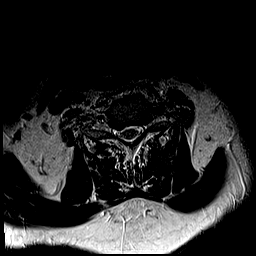
[im 18/27]
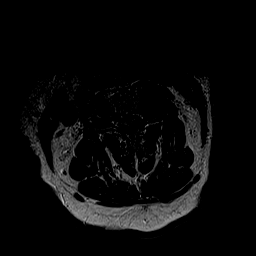
[im 27/27]
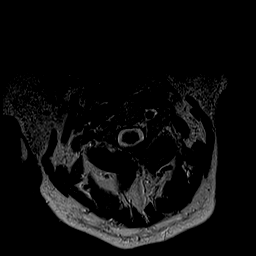

[Series 8: T1 · axial · non-contrast · 3.0mm · 0.35mm/px · z∈[-73,+20]mm · 4 of 27 slices shown (2 of 4)]
[im 1/27]
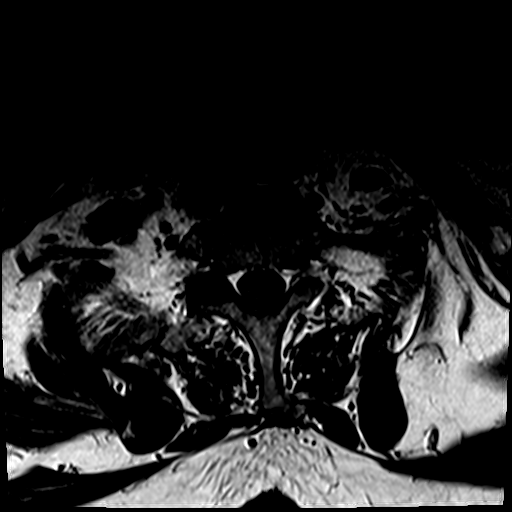
[im 9/27]
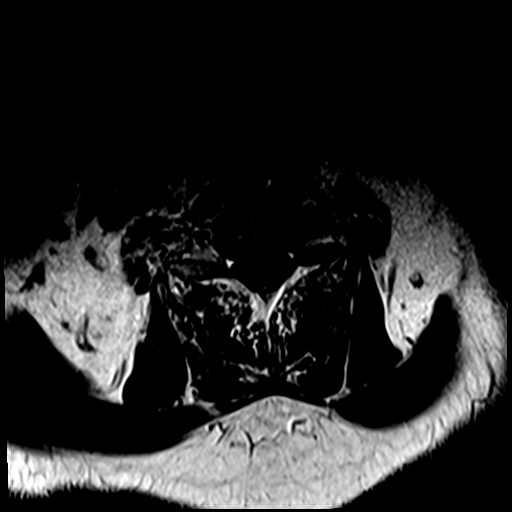
[im 18/27]
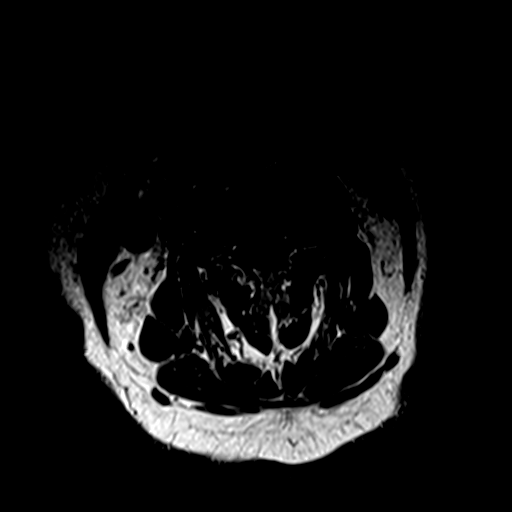
[im 27/27]
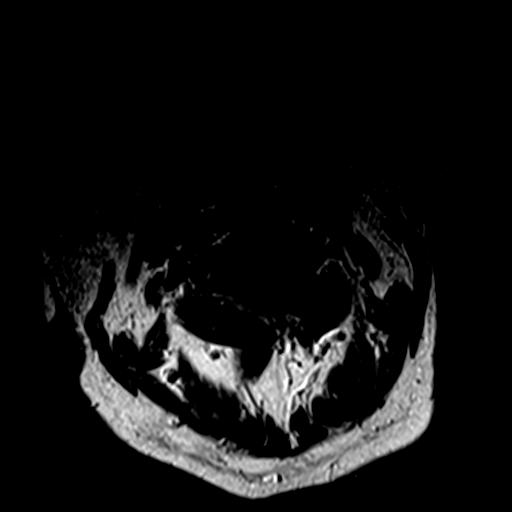

[Series 12: T2 · sagittal · 4.0mm · 0.57mm/px · 2 of 15 slices shown (3 of 4)]
[im 1/15]
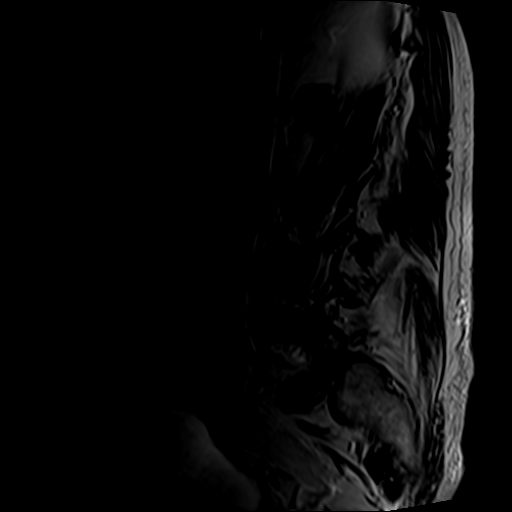
[im 15/15]
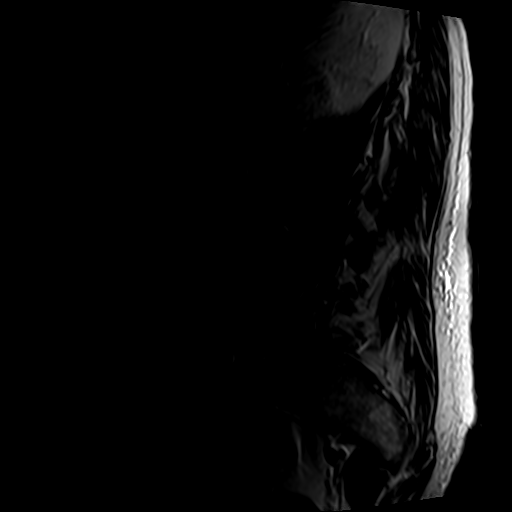

[Series 14: T1 · sagittal · 4.0mm · 0.57mm/px · 2 of 15 slices shown (3 of 4)]
[im 1/15]
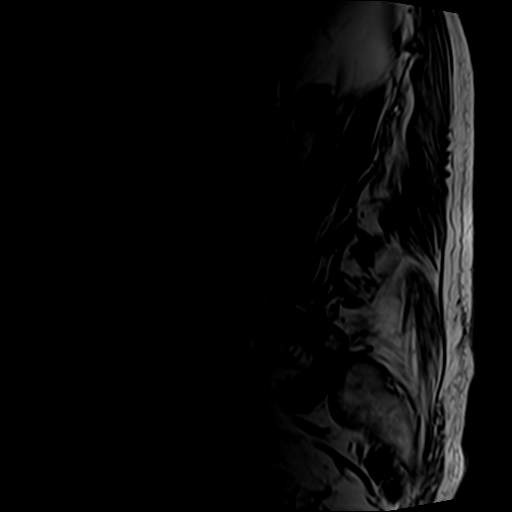
[im 15/15]
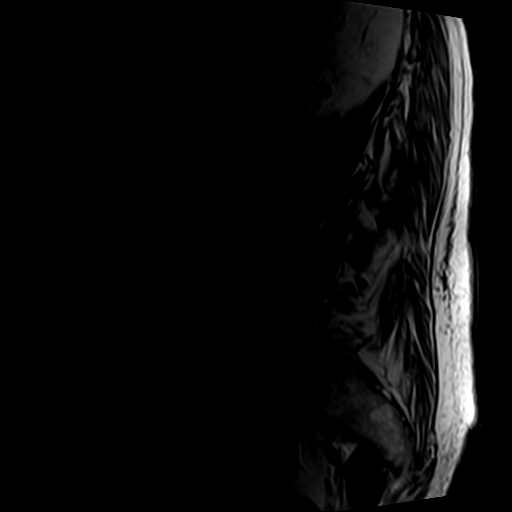

[Series 15: T2 · axial · 4.0mm · 0.70mm/px · z∈[-531,-304]mm · 6 of 40 slices shown (4 of 4)]
[im 1/40]
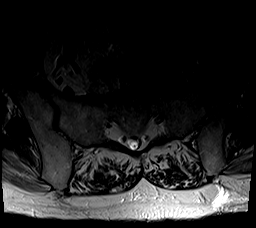
[im 8/40]
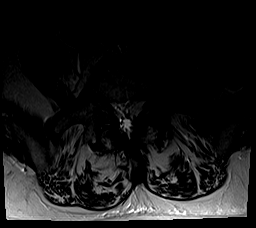
[im 16/40]
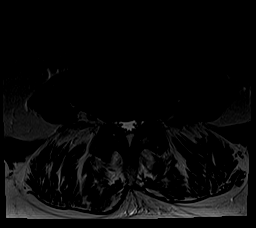
[im 24/40]
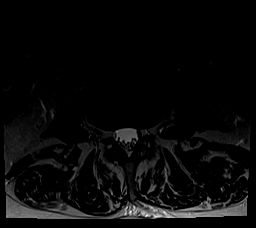
[im 32/40]
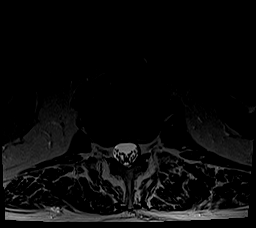
[im 40/40]
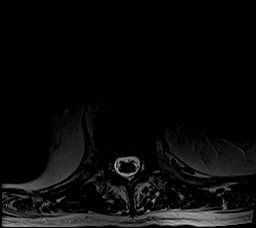

[Series 16: T1 · axial · 4.0mm · 0.35mm/px · z∈[-531,-304]mm · 6 of 40 slices shown (4 of 4)]
[im 1/40]
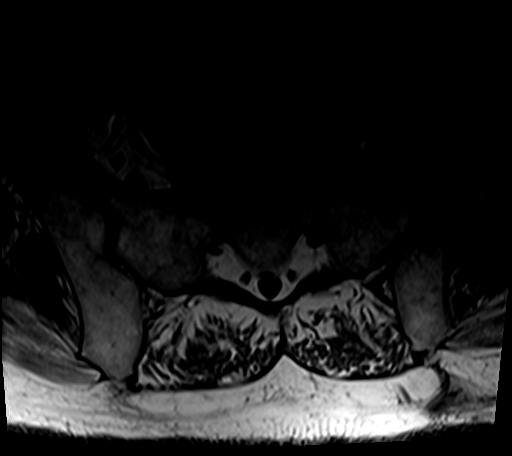
[im 8/40]
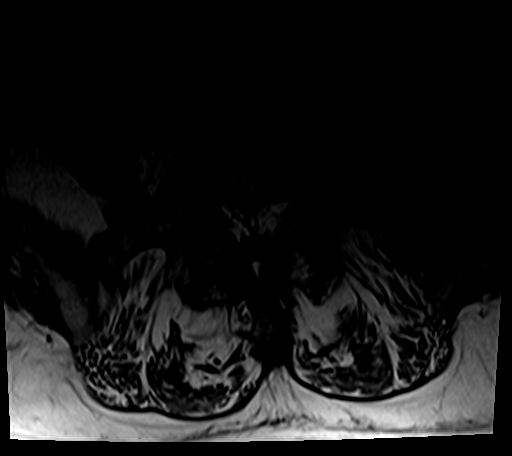
[im 16/40]
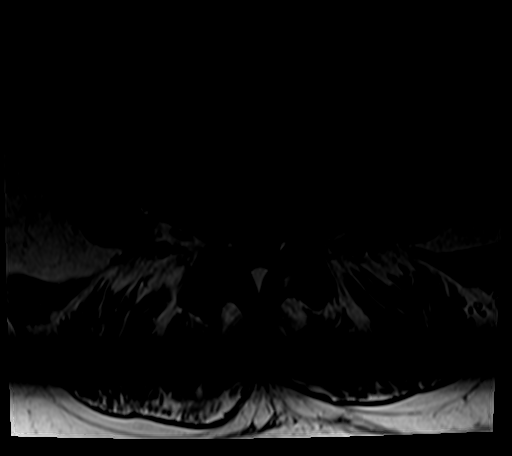
[im 24/40]
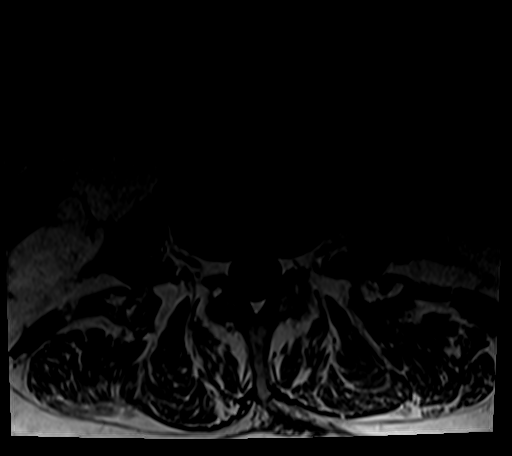
[im 32/40]
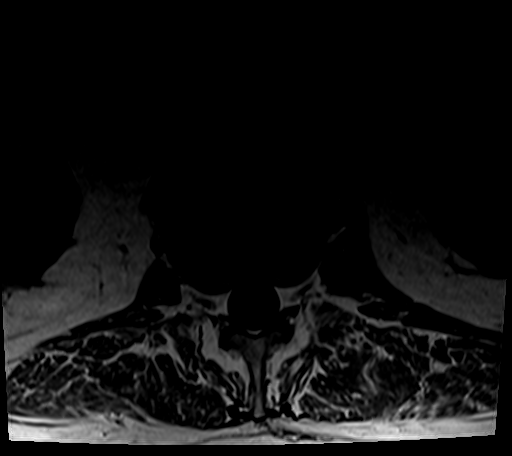
[im 40/40]
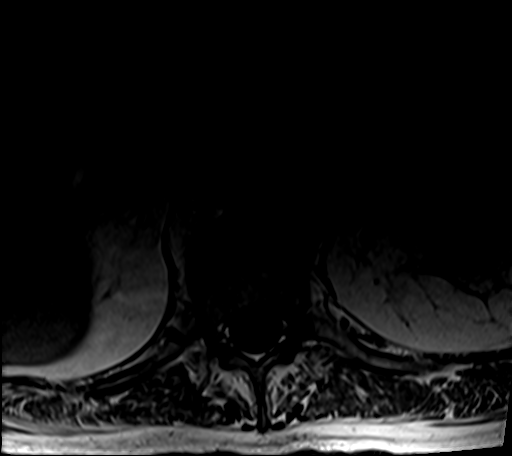

[26 of 48 positions shown; findings below may reference images not displayed]

FINDINGS: Alignment: Straightening without focal angulation or listhesis.

Vertebrae: No acute or suspicious osseous findings. There are
multilevel endplate degenerative changes.

Cord: There is chronic cord compression at the C3-4 and C4-5 levels
with probable chronic myelopathic changes. No cord edema identified.

Posterior Fossa, vertebral arteries, paraspinal tissues: Visualized
portions of the posterior fossa appear unremarkable.Bilateral
vertebral artery flow voids. The trachea appears displaced to the
right in the lower neck and there is a possible left thyroid nodule
which is incompletely visualized by this examination.

Disc levels:

C2-3: Mild disc bulging and uncinate spurring. No cord deformity or
significant foraminal narrowing.

C3-4: Spondylosis with loss of disc height and posterior osteophytes
covering diffusely bulging disc material. Mild bilateral facet
hypertrophy. Resulting chronic cord compression with narrowing of
the AP diameter of the canal to 3 mm. Moderate to severe osseous
foraminal narrowing bilaterally. Findings at this level appears
similar to previous maxillofacial CT.

C4-5: Spondylosis with loss of disc height and posterior osteophytes
covering diffusely bulging disc material. Mild bilateral facet
hypertrophy. Resulting chronic cord compression with narrowing the
AP diameter of the canal to 4-5 mm. Severe osseous foraminal
narrowing bilaterally.

C5-6: Lesser spondylosis at this level with posterior osteophytes
covering diffusely bulging disc material. The CSF surrounding the
cord is effaced without cord deformity or abnormal cord signal.
Moderate to severe osseous foraminal narrowing bilaterally.

C6-7: Mild spondylosis with asymmetric uncinate spurring on the
right. No cord deformity. Mild foraminal narrowing bilaterally.

C7-T1: Spondylosis with disc bulging and posterior osteophytes,
asymmetric to the left. Mild left foraminal narrowing. No cord
deformity.
IMPRESSION: 1. Severe multilevel spondylosis causing chronic cord compression at
the C3-4 and C4-5 levels. Probable associated chronic myelopathic
changes in the cord.
2. Spondylosis and uncinate spurring contribute to multilevel
foraminal narrowing, which appears moderate to severe bilaterally at
the C3-4, C4-5 and C5-6 levels.
3. No definite acute findings.
4. Possible tracheal deviation to the right at the level of the
thoracic inlet, and the possibility of a left thyroid nodule is
rays, incompletely visualized. Consider further evaluation with
thyroid ultrasound or neck CT.
5. Lumbar spine findings dictated separately.
6. These results will be called to the ordering clinician or
representative by the Radiologist Assistant, and communication
documented in the PACS or [REDACTED].

## 2021-04-09 MED ORDER — GADOBENATE DIMEGLUMINE 529 MG/ML IV SOLN
20.0000 mL | Freq: Once | INTRAVENOUS | Status: AC | PRN
  Administered 2021-04-09: 20 mL via INTRAVENOUS
